# Patient Record
Sex: Female | Born: 1960 | Race: White | Hispanic: No | Smoking: Never smoker
Health system: Southern US, Community
[De-identification: ages and names within clinical notes are randomized; demographics above are authoritative.]

## PROBLEM LIST (undated history)

## (undated) DIAGNOSIS — J309 Allergic rhinitis, unspecified: Secondary | ICD-10-CM

## (undated) DIAGNOSIS — M810 Age-related osteoporosis without current pathological fracture: Secondary | ICD-10-CM

## (undated) DIAGNOSIS — E785 Hyperlipidemia, unspecified: Secondary | ICD-10-CM

## (undated) DIAGNOSIS — R42 Dizziness and giddiness: Secondary | ICD-10-CM

## (undated) DIAGNOSIS — F32A Depression, unspecified: Secondary | ICD-10-CM

## (undated) DIAGNOSIS — F419 Anxiety disorder, unspecified: Secondary | ICD-10-CM

## (undated) DIAGNOSIS — F329 Major depressive disorder, single episode, unspecified: Secondary | ICD-10-CM

## (undated) HISTORY — DX: Allergic rhinitis, unspecified: J30.9

## (undated) HISTORY — DX: Dizziness and giddiness: R42

## (undated) HISTORY — DX: Depression, unspecified: F32.A

## (undated) HISTORY — DX: Age-related osteoporosis without current pathological fracture: M81.0

## (undated) HISTORY — DX: Anxiety disorder, unspecified: F41.9

## (undated) HISTORY — PX: HERNIA REPAIR: SHX51

## (undated) HISTORY — DX: Major depressive disorder, single episode, unspecified: F32.9

## (undated) HISTORY — DX: Hyperlipidemia, unspecified: E78.5

---

## 1998-09-27 ENCOUNTER — Other Ambulatory Visit: Admission: RE | Admit: 1998-09-27 | Discharge: 1998-09-27 | Payer: Self-pay | Admitting: Obstetrics and Gynecology

## 1999-10-25 ENCOUNTER — Other Ambulatory Visit: Admission: RE | Admit: 1999-10-25 | Discharge: 1999-10-25 | Payer: Self-pay | Admitting: Obstetrics and Gynecology

## 2001-03-25 ENCOUNTER — Other Ambulatory Visit: Admission: RE | Admit: 2001-03-25 | Discharge: 2001-03-25 | Payer: Self-pay | Admitting: Obstetrics and Gynecology

## 2002-04-14 ENCOUNTER — Other Ambulatory Visit: Admission: RE | Admit: 2002-04-14 | Discharge: 2002-04-14 | Payer: Self-pay | Admitting: Obstetrics and Gynecology

## 2003-07-16 ENCOUNTER — Other Ambulatory Visit: Admission: RE | Admit: 2003-07-16 | Discharge: 2003-07-16 | Payer: Self-pay | Admitting: Obstetrics and Gynecology

## 2003-07-23 ENCOUNTER — Encounter: Admission: RE | Admit: 2003-07-23 | Discharge: 2003-07-23 | Payer: Self-pay | Admitting: Obstetrics and Gynecology

## 2004-08-01 ENCOUNTER — Encounter: Admission: RE | Admit: 2004-08-01 | Discharge: 2004-08-01 | Payer: Self-pay | Admitting: Obstetrics and Gynecology

## 2004-10-19 ENCOUNTER — Other Ambulatory Visit: Admission: RE | Admit: 2004-10-19 | Discharge: 2004-10-19 | Payer: Self-pay | Admitting: Obstetrics and Gynecology

## 2005-09-27 ENCOUNTER — Encounter: Admission: RE | Admit: 2005-09-27 | Discharge: 2005-09-27 | Payer: Self-pay | Admitting: Obstetrics and Gynecology

## 2005-10-12 ENCOUNTER — Encounter: Admission: RE | Admit: 2005-10-12 | Discharge: 2005-10-12 | Payer: Self-pay | Admitting: Obstetrics and Gynecology

## 2006-10-14 ENCOUNTER — Encounter: Admission: RE | Admit: 2006-10-14 | Discharge: 2006-10-14 | Payer: Self-pay | Admitting: Obstetrics and Gynecology

## 2007-10-22 ENCOUNTER — Encounter: Admission: RE | Admit: 2007-10-22 | Discharge: 2007-10-22 | Payer: Self-pay | Admitting: Obstetrics and Gynecology

## 2008-03-31 ENCOUNTER — Ambulatory Visit: Payer: Self-pay | Admitting: Family Medicine

## 2008-04-02 ENCOUNTER — Telehealth (INDEPENDENT_AMBULATORY_CARE_PROVIDER_SITE_OTHER): Payer: Self-pay | Admitting: *Deleted

## 2008-04-02 LAB — CONVERTED CEMR LAB
Albumin: 4.1 g/dL (ref 3.5–5.2)
Alkaline Phosphatase: 71 units/L (ref 39–117)
BUN: 11 mg/dL (ref 6–23)
Basophils Absolute: 0.1 10*3/uL (ref 0.0–0.1)
Bilirubin, Direct: 0.1 mg/dL (ref 0.0–0.3)
Cholesterol: 227 mg/dL (ref 0–200)
Eosinophils Absolute: 0.2 10*3/uL (ref 0.0–0.7)
GFR calc Af Amer: 98 mL/min
GFR calc non Af Amer: 81 mL/min
HCT: 39.6 % (ref 36.0–46.0)
HDL: 81.2 mg/dL (ref >39.0)
MCHC: 34.1 g/dL (ref 30.0–36.0)
MCV: 94.2 fL (ref 78.0–100.0)
Monocytes Absolute: 0.5 10*3/uL (ref 0.1–1.0)
Platelets: 228 10*3/uL (ref 150–400)
Potassium: 5.4 meq/L — ABNORMAL HIGH (ref 3.5–5.1)
RDW: 12.1 % (ref 11.5–14.6)
Sodium: 142 meq/L (ref 135–145)
Total Bilirubin: 0.8 mg/dL (ref 0.3–1.2)
Triglycerides: 71 mg/dL (ref 0–149)

## 2008-04-16 ENCOUNTER — Ambulatory Visit: Payer: Self-pay | Admitting: Family Medicine

## 2008-04-19 ENCOUNTER — Encounter (INDEPENDENT_AMBULATORY_CARE_PROVIDER_SITE_OTHER): Payer: Self-pay | Admitting: *Deleted

## 2008-04-19 LAB — CONVERTED CEMR LAB
Chloride: 105 meq/L (ref 96–112)
GFR calc Af Amer: 98 mL/min
Glucose, Bld: 82 mg/dL (ref 70–99)
Potassium: 3.9 meq/L (ref 3.5–5.1)
Sodium: 137 meq/L (ref 135–145)

## 2008-10-26 ENCOUNTER — Encounter: Admission: RE | Admit: 2008-10-26 | Discharge: 2008-10-26 | Payer: Self-pay | Admitting: Obstetrics and Gynecology

## 2009-03-28 ENCOUNTER — Telehealth (INDEPENDENT_AMBULATORY_CARE_PROVIDER_SITE_OTHER): Payer: Self-pay | Admitting: *Deleted

## 2009-04-04 ENCOUNTER — Ambulatory Visit: Payer: Self-pay | Admitting: Internal Medicine

## 2009-04-04 DIAGNOSIS — M545 Low back pain, unspecified: Secondary | ICD-10-CM | POA: Insufficient documentation

## 2009-04-04 DIAGNOSIS — M549 Dorsalgia, unspecified: Secondary | ICD-10-CM | POA: Insufficient documentation

## 2009-04-04 LAB — CONVERTED CEMR LAB
Bilirubin Urine: NEGATIVE
Ketones, urine, test strip: NEGATIVE
Urobilinogen, UA: 0.2
pH: 5

## 2009-08-22 ENCOUNTER — Encounter: Admission: RE | Admit: 2009-08-22 | Discharge: 2009-08-22 | Payer: Self-pay | Admitting: Obstetrics and Gynecology

## 2009-10-31 ENCOUNTER — Encounter: Admission: RE | Admit: 2009-10-31 | Discharge: 2009-10-31 | Payer: Self-pay | Admitting: Obstetrics and Gynecology

## 2010-05-12 ENCOUNTER — Ambulatory Visit: Payer: Self-pay | Admitting: Family Medicine

## 2010-05-12 DIAGNOSIS — F4323 Adjustment disorder with mixed anxiety and depressed mood: Secondary | ICD-10-CM | POA: Insufficient documentation

## 2010-06-29 NOTE — Assessment & Plan Note (Signed)
Summary: anxiety/cbs   Vital Signs:  Patient profile:   50 year old female Weight:      118 pounds BMI:     18.14 Pulse rate:   122 / minute BP sitting:   122 / 68  (left arm)  Vitals Entered By: Doristine Devoid CMA (May 12, 2010 10:18 AM) CC: anxiety   History of Present Illness: 50 yo woman here today for anxiety.  discovered husband is having affair.  saw therapist yesterday- Britta Mccreedy ferrin.  has 2 daughters in HS, ages 51 ([place]) 44 Alison Stalling).  trying to protect kids.  therapist recommended meds- zoloft and klonopin.  pt is very stressed- not eating, having difficulty sleeping.  woman that husband was having affair with was the woman from his 1st affair 12 yrs ago- pt now knows that the affair never stopped.  husband actually bought other woman a house.  pt feels her life was 'stolen' and she's been 'living a lie'.  determined to make it through the holidays for the sake of her kids.  wants husband to leave but doesn't think he will.  'everybody loves a story...a train wreck...and that's not going to be me this year'.  denies SI/HI.  hasn't told family or friends yet.  Current Medications (verified): 1)  None  Allergies (verified): 1)  ! Sulfa  Past History:  Past medical, surgical, family and social histories (including risk factors) reviewed, and no changes noted (except as noted below).  Past Medical History: Reviewed history from 03/31/2008 and no changes required. hx of basal cells- [facility] Dr Candice Camp- GYN  Past Surgical History: Reviewed history from 03/31/2008 and no changes required. Csection (95, 97) Hernia 1967 Skin surgery  Family History: Reviewed history from 03/31/2008 and no changes required. Great aunt- breast CA, HTN, DM  Social History: Reviewed history from 04/04/2009 and no changes required. 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  trying to be strong and keep up appearances through the holidays.  fears what this will do to her children.  agree w/ therapist recommendations of controller med and benzo as needed for anxiety.  discussed meds and possible side effects.  total time spent w/ pt, 37 minutes, >50% spent counseling.  will follow closely.  Complete Medication List: 1)  Sertraline Hcl 50 Mg Tabs (Sertraline hcl) .Marland Kitchen.. 1 by mouth nightly 2)  Clonazepam 0.5 Mg Tabs (Clonazepam) .... 1/2-1 tab by mouth three times a day as needed for anxiety  Patient Instructions: 1)  Follow up in 3-4 weeks to recheck your mood and medicines 2)  I'll ask about a therapist recommendation and call you 3)  Remember, it's ok to not be strong every now and then! 4)  Hang in there!!! Prescriptions: CLONAZEPAM 0.5 MG TABS (CLONAZEPAM) 1/2-1 tab by mouth three times a day as needed for anxiety  #60 x 1   Entered and Authorized by:   Neena Rhymes MD   Signed by:   Neena Rhymes MD on 05/12/2010   Method used:   Print then Give to Patient   RxID:   [id number] SERTRALINE HCL 50 MG TABS (SERTRALINE HCL) 1 by mouth nightly  #  30 x 3   Entered and Authorized by:   Neena Rhymes MD   Signed by:   Neena Rhymes MD on 05/12/2010   Method used:   Print then Give to Patient   RxID:   [id number]    Orders Added: 1)  Est. Patient Level IV [25956]

## 2010-07-14 ENCOUNTER — Other Ambulatory Visit: Payer: Self-pay | Admitting: Obstetrics and Gynecology

## 2010-07-14 DIAGNOSIS — Z1231 Encounter for screening mammogram for malignant neoplasm of breast: Secondary | ICD-10-CM

## 2010-09-20 ENCOUNTER — Telehealth: Payer: Self-pay | Admitting: *Deleted

## 2010-09-20 NOTE — Telephone Encounter (Signed)
The pt dropped off papers in regards to jury duty and would like letter explaining exemption of the jury duty. Pt was notified per Dr. Beverely Low that she cannot excuse the pt because she does not have a qualifying condition. Per Beverely Low, she was seen in Dec 2011 for anxiety and depressed mood, and this does not qualify the pt for exemption. Pt is not happy with this and requests a call from Dr. Beverely Low personally.

## 2010-09-22 NOTE — Telephone Encounter (Signed)
Pts mother Leeanne Rio,  is a patient of Dr.Paz's- she states she cannot serve in jury duty due to her mothers conditions.  Dr.Tabori wrote on her jury duty letter that she could not serve due to her mother not being able to be left alone for extended periods of times. They need a letter from the mothers doctor explaining this. Dr.Paz would you write a letter validating this for this pt?

## 2010-09-22 NOTE — Telephone Encounter (Signed)
Left message explaining that we will not be able to write her out of jury duty.  Pt to call if she has questions.

## 2010-09-25 ENCOUNTER — Encounter: Payer: Self-pay | Admitting: Internal Medicine

## 2010-09-25 NOTE — Telephone Encounter (Signed)
Letter done

## 2010-09-25 NOTE — Telephone Encounter (Signed)
Will place up front

## 2011-01-24 ENCOUNTER — Ambulatory Visit
Admission: RE | Admit: 2011-01-24 | Discharge: 2011-01-24 | Disposition: A | Discharge status: Home / Self Care | Payer: 59 | Source: Ambulatory Visit | Attending: Obstetrics and Gynecology | Admitting: Obstetrics and Gynecology

## 2011-01-24 DIAGNOSIS — Z1231 Encounter for screening mammogram for malignant neoplasm of breast: Secondary | ICD-10-CM

## 2011-03-06 ENCOUNTER — Ambulatory Visit (INDEPENDENT_AMBULATORY_CARE_PROVIDER_SITE_OTHER): Payer: 59 | Admitting: Psychology

## 2011-03-06 DIAGNOSIS — F4323 Adjustment disorder with mixed anxiety and depressed mood: Secondary | ICD-10-CM

## 2011-03-22 ENCOUNTER — Ambulatory Visit (INDEPENDENT_AMBULATORY_CARE_PROVIDER_SITE_OTHER): Payer: 59 | Admitting: Psychology

## 2011-03-22 DIAGNOSIS — F4323 Adjustment disorder with mixed anxiety and depressed mood: Secondary | ICD-10-CM

## 2011-03-29 ENCOUNTER — Ambulatory Visit: Payer: 59 | Admitting: Psychology

## 2011-04-05 ENCOUNTER — Ambulatory Visit (INDEPENDENT_AMBULATORY_CARE_PROVIDER_SITE_OTHER): Payer: 59 | Admitting: Psychology

## 2011-04-05 DIAGNOSIS — F4323 Adjustment disorder with mixed anxiety and depressed mood: Secondary | ICD-10-CM

## 2011-04-12 ENCOUNTER — Ambulatory Visit: Payer: 59 | Admitting: Psychology

## 2011-04-17 ENCOUNTER — Ambulatory Visit: Payer: 59 | Admitting: Psychology

## 2011-04-26 ENCOUNTER — Ambulatory Visit (INDEPENDENT_AMBULATORY_CARE_PROVIDER_SITE_OTHER): Payer: 59 | Admitting: Psychology

## 2011-04-26 DIAGNOSIS — F4323 Adjustment disorder with mixed anxiety and depressed mood: Secondary | ICD-10-CM

## 2011-05-03 ENCOUNTER — Ambulatory Visit: Payer: 59 | Admitting: Psychology

## 2011-05-08 ENCOUNTER — Ambulatory Visit (INDEPENDENT_AMBULATORY_CARE_PROVIDER_SITE_OTHER): Payer: 59 | Admitting: Psychology

## 2011-05-08 DIAGNOSIS — F4323 Adjustment disorder with mixed anxiety and depressed mood: Secondary | ICD-10-CM

## 2011-05-17 ENCOUNTER — Ambulatory Visit (INDEPENDENT_AMBULATORY_CARE_PROVIDER_SITE_OTHER): Payer: 59 | Admitting: Psychology

## 2011-05-17 DIAGNOSIS — F4323 Adjustment disorder with mixed anxiety and depressed mood: Secondary | ICD-10-CM

## 2011-05-24 ENCOUNTER — Ambulatory Visit: Payer: 59 | Admitting: Psychology

## 2011-06-21 ENCOUNTER — Ambulatory Visit: Payer: 59 | Admitting: Psychology

## 2011-06-28 ENCOUNTER — Ambulatory Visit (INDEPENDENT_AMBULATORY_CARE_PROVIDER_SITE_OTHER): Payer: 59 | Admitting: Psychology

## 2011-06-28 DIAGNOSIS — F4323 Adjustment disorder with mixed anxiety and depressed mood: Secondary | ICD-10-CM

## 2011-07-04 ENCOUNTER — Encounter: Payer: Self-pay | Admitting: Family Medicine

## 2011-07-04 ENCOUNTER — Ambulatory Visit (INDEPENDENT_AMBULATORY_CARE_PROVIDER_SITE_OTHER): Payer: 59 | Admitting: Family Medicine

## 2011-07-04 DIAGNOSIS — Z0000 Encounter for general adult medical examination without abnormal findings: Secondary | ICD-10-CM

## 2011-07-04 LAB — LIPID PANEL
Cholesterol: 264 mg/dL — ABNORMAL HIGH (ref 0–200)
Triglycerides: 79 mg/dL (ref 0.0–149.0)

## 2011-07-04 LAB — CBC WITH DIFFERENTIAL/PLATELET
Eosinophils Relative: 4.3 % (ref 0.0–5.0)
HCT: 38.8 % (ref 36.0–46.0)
Hemoglobin: 13.2 g/dL (ref 12.0–15.0)
Lymphs Abs: 1.6 10*3/uL (ref 0.7–4.0)
Monocytes Relative: 7.9 % (ref 3.0–12.0)
Neutro Abs: 3.9 10*3/uL (ref 1.4–7.7)
WBC: 6.3 10*3/uL (ref 4.5–10.5)

## 2011-07-04 LAB — BASIC METABOLIC PANEL
CO2: 27 mEq/L (ref 19–32)
Calcium: 9.6 mg/dL (ref 8.4–10.5)
Creatinine, Ser: 0.6 mg/dL (ref 0.4–1.2)
Glucose, Bld: 86 mg/dL (ref 70–99)

## 2011-07-04 LAB — HEPATIC FUNCTION PANEL
Albumin: 4.3 g/dL (ref 3.5–5.2)
Total Protein: 7.1 g/dL (ref 6.0–8.3)

## 2011-07-04 NOTE — Progress Notes (Signed)
  Subjective:    Patient ID: Alexandra Hopkins, female    DOB: 02-14-1961, 51 y.o.   MRN: [id number]  HPI CPE- UTD on pap (Dr Rana Snare), Mammo.  Due for colonoscopy- referred to [facility] by [facility].   Review of Systems Patient reports no vision/ hearing changes, adenopathy,fever, weight change,  persistant/recurrent hoarseness , swallowing issues, chest pain, palpitations, edema, persistant/recurrent cough, hemoptysis, dyspnea (rest/exertional/paroxysmal nocturnal), gastrointestinal bleeding (melena, rectal bleeding), abdominal pain, significant heartburn, bowel changes, GU symptoms (dysuria, hematuria, incontinence), Gyn symptoms (abnormal  bleeding, pain),  syncope, focal weakness, memory loss, numbness & tingling, skin/hair/nail changes, abnormal bruising or bleeding.     Objective:   Physical Exam General Appearance:    Alert, cooperative, no distress, appears stated age  Head:    Normocephalic, without obvious abnormality, atraumatic  Eyes:    PERRL, conjunctiva/corneas clear, EOM's intact, fundi    benign, both eyes  Ears:    Normal TM's and external ear canals, both ears  Nose:   Nares normal, septum midline, mucosa normal, no drainage    or sinus tenderness  Throat:   Lips, mucosa, and tongue normal; teeth and gums normal  Neck:   Supple, symmetrical, trachea midline, no adenopathy;    Thyroid: no enlargement/tenderness/nodules  Back:     Symmetric, no curvature, ROM normal, no CVA tenderness  Lungs:     Clear to auscultation bilaterally, respirations unlabored  Chest Wall:    No tenderness or deformity   Heart:    Regular rate and rhythm, S1 and S2 normal, no murmur, rub   or gallop  Breast Exam:    Deferred to GYN  Abdomen:     Soft, non-tender, bowel sounds active all four quadrants,    no masses, no organomegaly  Genitalia:    Deferred to GYN  Rectal:    Extremities:   Extremities normal, atraumatic, no cyanosis or edema  Pulses:   2+ and symmetric all extremities    Skin:   Skin color, texture, turgor normal, no rashes or lesions  Lymph nodes:   Cervical, supraclavicular, and axillary nodes normal  Neurologic:   CNII-XII intact, normal strength, sensation and reflexes    throughout          Assessment & Plan:

## 2011-07-04 NOTE — Patient Instructions (Signed)
Follow up in 1 year or as needed We'll notify you of your lab results Call with any questions or concerns Hang in there!!!  You can do this!!!

## 2011-07-05 ENCOUNTER — Ambulatory Visit: Payer: 59 | Admitting: Psychology

## 2011-07-05 LAB — VITAMIN D 25 HYDROXY (VIT D DEFICIENCY, FRACTURES): Vit D, 25-Hydroxy: 24 ng/mL — ABNORMAL LOW (ref 30–89)

## 2011-07-09 ENCOUNTER — Encounter: Payer: Self-pay | Admitting: *Deleted

## 2011-07-09 MED ORDER — ATORVASTATIN CALCIUM 10 MG PO TABS: 10.0000 mg | ORAL_TABLET | Freq: Every day | ORAL | Status: DC

## 2011-07-09 MED ORDER — ERGOCALCIFEROL 1.25 MG (50000 UT) PO CAPS: 50000.0000 [IU] | ORAL_CAPSULE | ORAL | Status: AC

## 2011-07-11 NOTE — Assessment & Plan Note (Signed)
Pt's PE WNL.  UTD on GYN.  Has colonoscopy upcoming.  Check labs.  Anticipatory guidance provided.

## 2011-07-12 ENCOUNTER — Ambulatory Visit (INDEPENDENT_AMBULATORY_CARE_PROVIDER_SITE_OTHER): Payer: 59 | Admitting: Psychology

## 2011-07-12 DIAGNOSIS — F4323 Adjustment disorder with mixed anxiety and depressed mood: Secondary | ICD-10-CM

## 2011-07-19 ENCOUNTER — Ambulatory Visit: Payer: 59 | Admitting: Psychology

## 2011-07-26 ENCOUNTER — Ambulatory Visit (INDEPENDENT_AMBULATORY_CARE_PROVIDER_SITE_OTHER): Payer: 59 | Admitting: Psychology

## 2011-07-26 ENCOUNTER — Encounter: Payer: Self-pay | Admitting: *Deleted

## 2011-07-26 DIAGNOSIS — F4323 Adjustment disorder with mixed anxiety and depressed mood: Secondary | ICD-10-CM

## 2011-08-02 ENCOUNTER — Ambulatory Visit: Payer: 59 | Admitting: Psychology

## 2011-08-09 ENCOUNTER — Ambulatory Visit: Payer: 59 | Admitting: Psychology

## 2011-09-06 ENCOUNTER — Ambulatory Visit: Payer: 59 | Admitting: Psychology

## 2011-09-20 ENCOUNTER — Ambulatory Visit: Payer: 59 | Admitting: Psychology

## 2011-10-04 ENCOUNTER — Ambulatory Visit: Payer: 59 | Admitting: Psychology

## 2011-10-15 ENCOUNTER — Ambulatory Visit (INDEPENDENT_AMBULATORY_CARE_PROVIDER_SITE_OTHER): Payer: 59 | Admitting: Family Medicine

## 2011-10-15 ENCOUNTER — Encounter: Payer: Self-pay | Admitting: Family Medicine

## 2011-10-15 VITALS — BP 108/72 | HR 60 | Temp 98.2°F | Ht 66.25 in | Wt 120.4 lb

## 2011-10-15 DIAGNOSIS — F4323 Adjustment disorder with mixed anxiety and depressed mood: Secondary | ICD-10-CM

## 2011-10-15 MED ORDER — VENLAFAXINE HCL ER 37.5 MG PO CP24: 37.5000 mg | ORAL_CAPSULE | Freq: Every day | ORAL | Status: DC

## 2011-10-15 NOTE — Progress Notes (Signed)
  Subjective:    Patient ID: Alexandra Hopkins, female    DOB: Sep 21, 1960, 51 y.o.   MRN: [id number]  HPI Depression/anxiety- sxs started 1 yr ago when pt uncovered husband's long-term affair.  Separation is ongoing w/ multiple attempts at reconciliation.  Has 2 teen daughters that she has been trying to 'be strong for'.  Is having 'more down days than good days'.  Kids want dad around but pt feels less anxious when he stays away.  Friend recommended Viibryd.   Review of Systems For ROS see HPI     Objective:   Physical Exam  Vitals reviewed. Constitutional: She appears well-developed and well-nourished.  Psychiatric:       Tearful, anxious          Assessment & Plan:

## 2011-10-15 NOTE — Assessment & Plan Note (Signed)
Deteriorated.  Start SNRI daily, refer for counseling.

## 2011-10-15 NOTE — Patient Instructions (Signed)
Follow up in 1 month to recheck meds Start the Effexor daily Call [phone] and ask to schedule an appt w/ [facility] Call with any questions or concerns Hang in there!!

## 2011-10-18 ENCOUNTER — Ambulatory Visit: Payer: 59 | Admitting: Psychology

## 2011-10-23 ENCOUNTER — Telehealth: Payer: Self-pay | Admitting: Family Medicine

## 2011-10-23 NOTE — Telephone Encounter (Signed)
I spoke with patient and informed her that she was not recently seen for sinus infection and would need an appointment for ABX. Patient agreed to see Dr.Paz tomorrow at 10:00 am (Patient's primary-Dr.Tabori did not have any openings)

## 2011-10-23 NOTE — Telephone Encounter (Signed)
Patient called and would like an antibiotic called in for a sinus infections. She stated she was just in here 5.20.13. - Was seen for depression/anxiety Can call patient at [phone]

## 2011-10-24 ENCOUNTER — Encounter: Payer: Self-pay | Admitting: Internal Medicine

## 2011-10-24 ENCOUNTER — Ambulatory Visit (INDEPENDENT_AMBULATORY_CARE_PROVIDER_SITE_OTHER): Payer: 59 | Admitting: Internal Medicine

## 2011-10-24 VITALS — BP 102/68 | HR 81 | Temp 97.7°F | Wt 119.0 lb

## 2011-10-24 DIAGNOSIS — J069 Acute upper respiratory infection, unspecified: Secondary | ICD-10-CM

## 2011-10-24 MED ORDER — AZELASTINE HCL 0.1 % NA SOLN: 2.0000 | Freq: Two times a day (BID) | NASAL | Status: DC

## 2011-10-24 MED ORDER — AMOXICILLIN 500 MG PO CAPS: 1000.0000 mg | ORAL_CAPSULE | Freq: Two times a day (BID) | ORAL | Status: AC

## 2011-10-24 NOTE — Patient Instructions (Signed)
Rest, fluids , tylenol Take Mucinex  twice a day as needed  For congestion use astelin nasal spray twice a day until you feel better Change Claritin-D to Claritin 10 mg one daily until you feel better Take the antibiotic as prescribed  (Amoxicillin) only if not improving in the next 3-4  days Call if no better in few days Call anytime if the symptoms are severe

## 2011-10-24 NOTE — Progress Notes (Signed)
  Subjective:    Patient ID: Alexandra Hopkins, female    DOB: 1960-12-27, 51 y.o.   MRN: [id number]  HPI Acute visit One-week history of sinus congestion, thick clear nasal discharge. Has been taking Claritin-D and NyQuil without much help.  Past medical history High cholesterol Menopause  Past surgical history C-section x2  Review of Systems No fever or chills No cough or chest congestion No postnasal dripping. Was recently seen with anxiety, still thinking about taking Effexor but is not sure. Currently not taking it    Objective:   Physical Exam General -- alert, well-developed, and well-nourished.   HEENT -- TMs normal, throat w/o redness, face symmetric and not tender to palpation, nose is slightly congested  Lungs -- normal respiratory effort, no intercostal retractions, no accessory muscle use, and normal breath sounds.   Heart-- normal rate, regular rhythm, no murmur, and no gallop.   Extremities-- no pretibial edema bilaterally Psych-- Cognition and judgment appear intact. Alert and cooperative with normal attention span and concentration.  not anxious appearing and not depressed appearing.       Assessment & Plan:   URI, see instructions

## 2012-02-14 ENCOUNTER — Other Ambulatory Visit: Payer: Self-pay | Admitting: Obstetrics and Gynecology

## 2012-02-14 DIAGNOSIS — Z1231 Encounter for screening mammogram for malignant neoplasm of breast: Secondary | ICD-10-CM

## 2012-03-07 ENCOUNTER — Ambulatory Visit: Payer: 59

## 2012-03-27 ENCOUNTER — Ambulatory Visit: Payer: 59

## 2012-04-21 ENCOUNTER — Ambulatory Visit
Admission: RE | Admit: 2012-04-21 | Discharge: 2012-04-21 | Disposition: A | Discharge status: Home / Self Care | Payer: 59 | Source: Ambulatory Visit | Attending: Obstetrics and Gynecology | Admitting: Obstetrics and Gynecology

## 2012-04-21 DIAGNOSIS — Z1231 Encounter for screening mammogram for malignant neoplasm of breast: Secondary | ICD-10-CM

## 2012-06-04 ENCOUNTER — Ambulatory Visit (INDEPENDENT_AMBULATORY_CARE_PROVIDER_SITE_OTHER): Payer: 59 | Admitting: Family Medicine

## 2012-06-04 ENCOUNTER — Encounter: Payer: Self-pay | Admitting: Family Medicine

## 2012-06-04 VITALS — BP 110/60 | HR 82 | Temp 97.7°F | Ht 66.25 in | Wt 117.4 lb

## 2012-06-04 DIAGNOSIS — J329 Chronic sinusitis, unspecified: Secondary | ICD-10-CM

## 2012-06-04 MED ORDER — AMOXICILLIN 875 MG PO TABS: 875.0000 mg | ORAL_TABLET | Freq: Two times a day (BID) | ORAL | Status: DC

## 2012-06-04 NOTE — Progress Notes (Signed)
  Subjective:    Patient ID: Alexandra Hopkins, female    DOB: 1960/10/12, 52 y.o.   MRN: [id number]  HPI ? Sinus infxn- sxs started 7-10 days ago, + nasal congestion, fatigue.  Recent air travel.  + sinus pain/pressure.  + PND at night, dry cough, bilateral ear pressure.  Hx of seasonal allergies and multiple sinus infections.  No fevers.  + sick contacts.   Review of Systems For ROS see HPI     Objective:   Physical Exam  Constitutional: She appears well-developed and well-nourished. No distress.  HENT:  Head: Normocephalic and atraumatic.  Right Ear: Tympanic membrane normal.  Left Ear: Tympanic membrane normal.  Nose: Mucosal edema and rhinorrhea present. Right sinus exhibits maxillary sinus tenderness and frontal sinus tenderness. Left sinus exhibits maxillary sinus tenderness and frontal sinus tenderness.  Mouth/Throat: Uvula is midline and mucous membranes are normal. Posterior oropharyngeal erythema present. No oropharyngeal exudate.  Eyes: Conjunctivae normal and EOM are normal. Pupils are equal, round, and reactive to light.  Neck: Normal range of motion. Neck supple.  Cardiovascular: Normal rate, regular rhythm and normal heart sounds.   Pulmonary/Chest: Effort normal and breath sounds normal. No respiratory distress. She has no wheezes.  Lymphadenopathy:    She has no cervical adenopathy.          Assessment & Plan:

## 2012-06-04 NOTE — Assessment & Plan Note (Signed)
New.  Start abx.  Reviewed supportive care and red flags that should prompt return.  Pt expressed understanding and is in agreement w/ plan.  

## 2012-06-04 NOTE — Patient Instructions (Addendum)
This is a sinus infection Start the Amox twice daily- take w/ food Mucinex to thin your congestion Drink plenty of fluids REST! Hang in there!!

## 2012-07-11 ENCOUNTER — Encounter: Payer: 59 | Admitting: Family Medicine

## 2012-07-18 ENCOUNTER — Telehealth: Payer: Self-pay | Admitting: *Deleted

## 2012-07-18 ENCOUNTER — Other Ambulatory Visit (INDEPENDENT_AMBULATORY_CARE_PROVIDER_SITE_OTHER): Payer: 59

## 2012-07-18 DIAGNOSIS — Z0000 Encounter for general adult medical examination without abnormal findings: Secondary | ICD-10-CM

## 2012-07-18 LAB — CBC WITH DIFFERENTIAL/PLATELET
Basophils Relative: 0.6 % (ref 0.0–3.0)
Eosinophils Absolute: 0.2 10*3/uL (ref 0.0–0.7)
Eosinophils Relative: 3.3 % (ref 0.0–5.0)
Lymphocytes Relative: 27.4 % (ref 12.0–46.0)
MCHC: 33.4 g/dL (ref 30.0–36.0)
Neutrophils Relative %: 61.4 % (ref 43.0–77.0)
RBC: 4.29 Mil/uL (ref 3.87–5.11)
WBC: 6.4 10*3/uL (ref 4.5–10.5)

## 2012-07-18 LAB — TESTOSTERONE: Testosterone: 21.96 ng/dL (ref 10.00–70.00)

## 2012-07-18 LAB — BASIC METABOLIC PANEL
BUN: 13 mg/dL (ref 6–23)
Chloride: 106 mEq/L (ref 96–112)
Potassium: 4 mEq/L (ref 3.5–5.1)

## 2012-07-18 LAB — HEPATIC FUNCTION PANEL
ALT: 16 U/L (ref 0–35)
AST: 20 U/L (ref 0–37)
Total Bilirubin: 0.7 mg/dL (ref 0.3–1.2)

## 2012-07-18 LAB — LDL CHOLESTEROL, DIRECT: Direct LDL: 147.9 mg/dL

## 2012-07-18 LAB — LIPID PANEL
HDL: 75.9 mg/dL (ref >39.00)
Total CHOL/HDL Ratio: 4
VLDL: 19.8 mg/dL (ref 0.0–40.0)

## 2012-07-18 NOTE — Telephone Encounter (Signed)
Pt states that she was a little concern because she did not received any paperwork from lab visit and just wanted to verify that the correct labs orders were drawn. Spoke with Pt and advise her of the office protocol for collecting labs, Pt ok info and states that she feels a lot better now that she knows her blood is not lost..

## 2012-07-18 NOTE — Telephone Encounter (Signed)
Patient presented to office, thought her CPE appt was today but it is not actually scheduled until next month. Patient requested to have labs drawn today.Marland Kitchenalso adamant on having testosterone levels drawn along with other labs. Dr. Beverely Low made aware.

## 2012-07-22 ENCOUNTER — Telehealth: Payer: Self-pay | Admitting: *Deleted

## 2012-07-22 MED ORDER — ATORVASTATIN CALCIUM 10 MG PO TABS: 10.0000 mg | ORAL_TABLET | Freq: Every day | ORAL | Status: DC

## 2012-07-22 NOTE — Telephone Encounter (Signed)
Message copied by Verdie Shire on Tue Jul 22, 2012  4:30 PM ------      Message from: Sheliah Hatch      Created: Sun Jul 20, 2012  5:28 PM       Pt's labs look good w/ exception of total cholesterol and LDL.  Needs to restart Lipitor 10mg  nightly. ------

## 2012-07-22 NOTE — Telephone Encounter (Signed)
Spoke with the pt and informed her of recent lab results and note.  Pt understood and agreed.  New rx sent to the pharmacy([facility]) by e-script.//AB/CMA

## 2012-07-24 ENCOUNTER — Encounter: Payer: Self-pay | Admitting: *Deleted

## 2012-07-28 ENCOUNTER — Telehealth: Payer: Self-pay | Admitting: *Deleted

## 2012-08-15 ENCOUNTER — Encounter: Payer: 59 | Admitting: Family Medicine

## 2012-08-22 ENCOUNTER — Encounter: Payer: 59 | Admitting: Family Medicine

## 2012-08-22 ENCOUNTER — Encounter: Payer: Self-pay | Admitting: Family Medicine

## 2012-08-22 ENCOUNTER — Ambulatory Visit (INDEPENDENT_AMBULATORY_CARE_PROVIDER_SITE_OTHER): Payer: 59 | Admitting: Family Medicine

## 2012-08-22 VITALS — BP 110/50 | HR 77 | Temp 98.1°F | Ht 66.5 in | Wt 117.4 lb

## 2012-08-22 DIAGNOSIS — Z23 Encounter for immunization: Secondary | ICD-10-CM

## 2012-08-22 DIAGNOSIS — Z0000 Encounter for general adult medical examination without abnormal findings: Secondary | ICD-10-CM

## 2012-08-22 NOTE — Patient Instructions (Addendum)
Follow up in 6 months to recheck cholesterol Continue the Lipitor daily Keep up the good work!  You look great! Happy Early [place] Ouch!!!

## 2012-08-22 NOTE — Progress Notes (Signed)
  Subjective:    Patient ID: Alexandra Hopkins, female    DOB: [birth year], 52 y.o.   MRN: [id number]  HPI CPE- UTD on pap, mammo, colonoscopy.  No concerns.   Review of Systems Patient reports no vision/ hearing changes, adenopathy,fever, weight change,  persistant/recurrent hoarseness , swallowing issues, chest pain, palpitations, edema, persistant/recurrent cough, hemoptysis, dyspnea (rest/exertional/paroxysmal nocturnal), gastrointestinal bleeding (melena, rectal bleeding), abdominal pain, significant heartburn, bowel changes, GU symptoms (dysuria, hematuria, incontinence), Gyn symptoms (abnormal  bleeding, pain),  syncope, focal weakness, memory loss, numbness & tingling, skin/hair/nail changes, abnormal bruising or bleeding, anxiety, or depression.     Objective:   Physical Exam General Appearance:    Alert, cooperative, no distress, appears stated age  Head:    Normocephalic, without obvious abnormality, atraumatic  Eyes:    PERRL, conjunctiva/corneas clear, EOM's intact, fundi    benign, both eyes  Ears:    Normal TM's and external ear canals, both ears  Nose:   Nares normal, septum midline, mucosa normal, no drainage    or sinus tenderness  Throat:   Lips, mucosa, and tongue normal; teeth and gums normal  Neck:   Supple, symmetrical, trachea midline, no adenopathy;    Thyroid: no enlargement/tenderness/nodules  Back:     Symmetric, no curvature, ROM normal, no CVA tenderness  Lungs:     Clear to auscultation bilaterally, respirations unlabored  Chest Wall:    No tenderness or deformity   Heart:    Regular rate and rhythm, S1 and S2 normal, no murmur, rub   or gallop  Breast Exam:    Deferred to GYN  Abdomen:     Soft, non-tender, bowel sounds active all four quadrants,    no masses, no organomegaly  Genitalia:    Deferred to GYN  Rectal:    Extremities:   Extremities normal, atraumatic, no cyanosis or edema  Pulses:   2+ and symmetric all extremities  Skin:   Skin color,  texture, turgor normal, no rashes or lesions  Lymph nodes:   Cervical, supraclavicular, and axillary nodes normal  Neurologic:   CNII-XII intact, normal strength, sensation and reflexes    throughout          Assessment & Plan:

## 2012-08-22 NOTE — Assessment & Plan Note (Signed)
Pt's PE WNL.  UTD on health maintenance.  Reviewed labs.  EKG done- see document for interpretation. Anticipatory guidance provided.

## 2012-09-18 ENCOUNTER — Telehealth: Payer: Self-pay | Admitting: Family Medicine

## 2012-09-18 NOTE — Telephone Encounter (Signed)
LM @ (1:56pm) asking the pt to RTC.//AB/CMA

## 2012-09-18 NOTE — Telephone Encounter (Signed)
Pt was seen 1 month ago- not last week.  Needs OV prior to abx.  Tomorrow is our best option and I can see her if she is unable to see Dr Drue Novel at [facility].

## 2012-09-18 NOTE — Telephone Encounter (Signed)
Pt called today and wanted an appointment with Dr Beverely Low for a sinus infection. I told pt that Dr Beverely Low did not have any available today nor any other provider. At that point pt started to get very aggravated and hostile as the conversation went on. Told pt that I could get her in tomorrow with dr Drue Novel at 9:00am. She did not like that time spot either and stated that she will just get there when ever she can. She also stated can Dr Beverely Low nurse call her back because she was just in here last week and maybe her nurse could write her a rx. I told pt that I would put a phone a note in for her to call her back. Pt then stated that she see why people go to an urgent care because it shouldn't take a rocket scientist to write a rx for a z-pack.

## 2012-09-18 NOTE — Telephone Encounter (Signed)
Unfortunately if pt is unable to make any available appts, the next best option is an UC

## 2012-09-18 NOTE — Telephone Encounter (Signed)
Spoke with the pt and she stated that she has a meeting on tomorrow and it is going be hard for her to come in here and wait to be seen.  Informed her of Dr. Rennis Golden recommendation below.  Informed her that Dr. Beverely Low could see her tomorrow afternoon if she is unable to see Dr. Drue Novel in the am, and she stated that her meeting is going to last all afternoon and she can't come then.  Pt said she will see if she can make the appt in the am with Dr. Drue Novel.  Pt kept talking about she has a meeting and it starts at 9:00am and she has all these peoples coming to the meeting, and she had to work around their schedules.  She asked if Dr. Beverely Low could see her today and I explained that Dr. Beverely Low has just gotten back from been out of the office, and her schedule is full.  Pt stated I know.  Pt asked me if I can see why people go to the Urgent Care.  Informed the pt again that she will need to be seen before we can give her a ATB.  Pt stated okay.//AB/CMA

## 2012-09-19 ENCOUNTER — Encounter: Payer: Self-pay | Admitting: Internal Medicine

## 2012-09-19 ENCOUNTER — Ambulatory Visit (INDEPENDENT_AMBULATORY_CARE_PROVIDER_SITE_OTHER): Payer: 59 | Admitting: Internal Medicine

## 2012-09-19 VITALS — BP 98/62 | HR 73 | Temp 97.9°F | Wt 117.0 lb

## 2012-09-19 DIAGNOSIS — J309 Allergic rhinitis, unspecified: Secondary | ICD-10-CM

## 2012-09-19 MED ORDER — ZOLPIDEM TARTRATE 10 MG PO TABS: 10.0000 mg | ORAL_TABLET | Freq: Every evening | ORAL | Status: DC | PRN

## 2012-09-19 MED ORDER — AMOXICILLIN 500 MG PO CAPS: 1000.0000 mg | ORAL_CAPSULE | Freq: Two times a day (BID) | ORAL | Status: AC

## 2012-09-19 MED ORDER — AZELASTINE HCL 0.1 % NA SOLN: 2.0000 | Freq: Two times a day (BID) | NASAL | Status: DC

## 2012-09-19 NOTE — Patient Instructions (Addendum)
If  cough, take Mucinex DM twice a day as needed  For congestion use astelin nasal spray twice a day until you feel better OTC claritin 10 mg 1 a day Call if no better in few days Call anytime if the symptoms are severe

## 2012-09-19 NOTE — Assessment & Plan Note (Addendum)
Symptoms consistent with allergic rhinitis but she is sure has a infection. Plan: Treat as allergies, see instructions. If not better will start amoxicillin.

## 2012-09-19 NOTE — Progress Notes (Signed)
  Subjective:    Patient ID: Alexandra Hopkins, female    DOB: 03/24/1961, 52 y.o.   MRN: [id number]  HPI Acute visit Symptoms started 2 weeks ago: Sore throat, head and ears feel "full". Has not taken any specific medication for this symptoms, denies cough. Also uses half Ambien sporadically, would like a prescription.  Past Medical History  Diagnosis Date  . Anxiety   . Hyperlipidemia   . Allergic rhinitis    Past Surgical History  Procedure Laterality Date  . Cesarean section      x 2    Review of Systems Denies fever chills, mild sneezing, nose itching. Denies eye symptoms. No nausea, vomiting, diarrhea.    Objective:   Physical Exam  General -- alert, well-developed, No apparent distress, vital signs are stable    HEENT -- TMs normal, throat w/o redness, face symmetric and not tender to palpation,Nose is slightly congested Lungs -- normal respiratory effort, no intercostal retractions, no accessory muscle use, and normal breath sounds.   Heart-- normal rate, regular rhythm, no murmur, and no gallop.   Extremities-- no pretibial edema bilaterally Psych-- Cognition and judgment appear intact. Alert and cooperative with normal attention span and concentration.  not anxious appearing and not depressed appearing.      Assessment & Plan:

## 2012-09-25 ENCOUNTER — Telehealth: Payer: Self-pay | Admitting: Family Medicine

## 2012-09-25 NOTE — Telephone Encounter (Signed)
Ok for Zpack 

## 2012-09-25 NOTE — Telephone Encounter (Signed)
Patient Information:  Caller Name: [name]  Phone: [phone]  Patient: Alexandra Hopkins  Gender: Female  DOB: [birth year]  Age: 51 Years  PCP: Sheliah Hatch  Pregnant: No  Office Follow Up:  Does the office need to follow up with this patient?: Yes  Instructions For The Office: See RN Notes  RN Note:  Patient is requesting prescription for Z Pack since Amoxicillin is not helping her symptoms.  Symptoms  Reason For Call & Symptoms: Reports was seen in office last week for sinus infection. Given Amoxicillin to take and symptoms are not improving. Still having nasal congestion, headache, etc.  Reviewed Health History In EMR: Yes  Reviewed Medications In EMR: Yes  Reviewed Allergies In EMR: Yes  Reviewed Surgeries / Procedures: Yes  Date of Onset of Symptoms: 09/16/2012  Treatments Tried: Amoxicillin twice a day.  Treatments Tried Worked: No OB / GYN:  LMP: Unknown  Guideline(s) Used:  Sinus Pain and Congestion  Disposition Per Guideline:   Go to Office Now  Reason For Disposition Reached:   Severe sinus pain  Advice Given:  N/A  Patient Refused Recommendation:  Patient Requests Prescription  Wants Z Pack called in.

## 2012-09-26 MED ORDER — AZITHROMYCIN 250 MG PO TABS: ORAL_TABLET | ORAL | Status: DC

## 2012-09-26 NOTE — Telephone Encounter (Signed)
Spoke with the pt and informed her that Dr. Tawni Carnes the Zpak, and new rx will be sent to her pharmacy.  Pt agreed.//AB/CMA

## 2013-02-17 ENCOUNTER — Encounter: Payer: Self-pay | Admitting: Lab

## 2013-02-18 ENCOUNTER — Ambulatory Visit: Payer: 59 | Admitting: Family Medicine

## 2013-02-18 DIAGNOSIS — Z0289 Encounter for other administrative examinations: Secondary | ICD-10-CM

## 2013-03-19 ENCOUNTER — Other Ambulatory Visit: Payer: Self-pay

## 2013-03-19 DIAGNOSIS — Z1231 Encounter for screening mammogram for malignant neoplasm of breast: Secondary | ICD-10-CM

## 2013-04-27 LAB — HM PAP SMEAR: HM Pap smear: NORMAL

## 2013-04-30 ENCOUNTER — Ambulatory Visit
Admission: RE | Admit: 2013-04-30 | Discharge: 2013-04-30 | Disposition: A | Discharge status: Home / Self Care | Payer: 59 | Source: Ambulatory Visit

## 2013-04-30 DIAGNOSIS — Z1231 Encounter for screening mammogram for malignant neoplasm of breast: Secondary | ICD-10-CM

## 2014-03-26 ENCOUNTER — Other Ambulatory Visit: Payer: Self-pay

## 2014-03-26 DIAGNOSIS — Z1231 Encounter for screening mammogram for malignant neoplasm of breast: Secondary | ICD-10-CM

## 2014-04-09 ENCOUNTER — Ambulatory Visit (INDEPENDENT_AMBULATORY_CARE_PROVIDER_SITE_OTHER): Payer: 59 | Admitting: Family Medicine

## 2014-04-09 ENCOUNTER — Encounter: Payer: Self-pay | Admitting: Family Medicine

## 2014-04-09 DIAGNOSIS — Z0000 Encounter for general adult medical examination without abnormal findings: Secondary | ICD-10-CM

## 2014-04-09 LAB — CBC WITH DIFFERENTIAL/PLATELET
BASOS ABS: 0 10*3/uL (ref 0.0–0.1)
BASOS PCT: 0 % (ref 0–1)
EOS ABS: 0.2 10*3/uL (ref 0.0–0.7)
Eosinophils Relative: 2 % (ref 0–5)
HCT: 40.6 % (ref 36.0–46.0)
HEMOGLOBIN: 13.7 g/dL (ref 12.0–15.0)
Lymphocytes Relative: 21 % (ref 12–46)
Lymphs Abs: 1.8 10*3/uL (ref 0.7–4.0)
MCH: 30.8 pg (ref 26.0–34.0)
MCHC: 33.7 g/dL (ref 30.0–36.0)
MCV: 91.2 fL (ref 78.0–100.0)
MONOS PCT: 12 % (ref 3–12)
Monocytes Absolute: 1 10*3/uL (ref 0.1–1.0)
NEUTROS ABS: 5.5 10*3/uL (ref 1.7–7.7)
NEUTROS PCT: 65 % (ref 43–77)
PLATELETS: 261 10*3/uL (ref 150–400)
RBC: 4.45 MIL/uL (ref 3.87–5.11)
RDW: 13.5 % (ref 11.5–15.5)
WBC: 8.4 10*3/uL (ref 4.0–10.5)

## 2014-04-09 LAB — TSH: TSH: 1.836 u[IU]/mL (ref 0.350–4.500)

## 2014-04-09 MED ORDER — ZOLPIDEM TARTRATE 10 MG PO TABS: 10.0000 mg | ORAL_TABLET | Freq: Every evening | ORAL | Status: DC | PRN

## 2014-04-09 NOTE — Patient Instructions (Signed)
Follow up in 1 year or as needed We'll notify you of your lab results and make any changes if needed Keep up the good work!  You look great!! Call with any questions or concerns Happy Holidays!!! 

## 2014-04-09 NOTE — Progress Notes (Signed)
   Subjective:    Patient ID: Alexandra Hopkins, female    DOB: 01/28/1961, 53 y.o.   MRN: [id number]  HPI CPE- UTD on colonoscopy, mammo, GYN.   Review of Systems Patient reports no vision/ hearing changes, adenopathy,fever, weight change,  persistant/recurrent hoarseness , swallowing issues, chest pain, palpitations, edema, persistant/recurrent cough, hemoptysis, dyspnea (rest/exertional/paroxysmal nocturnal), gastrointestinal bleeding (melena, rectal bleeding), abdominal pain, significant heartburn, bowel changes, GU symptoms (dysuria, hematuria, incontinence), Gyn symptoms (abnormal  bleeding, pain),  syncope, focal weakness, memory loss, numbness & tingling, skin/hair/nail changes, abnormal bruising or bleeding, anxiety, or depression.     Objective:   Physical Exam General Appearance:    Alert, cooperative, no distress, appears stated age  Head:    Normocephalic, without obvious abnormality, atraumatic  Eyes:    PERRL, conjunctiva/corneas clear, EOM's intact, fundi    benign, both eyes  Ears:    Normal TM's and external ear canals, both ears  Nose:   Nares normal, septum midline, mucosa normal, no drainage    or sinus tenderness  Throat:   Lips, mucosa, and tongue normal; teeth and gums normal  Neck:   Supple, symmetrical, trachea midline, no adenopathy;    Thyroid: no enlargement/tenderness/nodules  Back:     Symmetric, no curvature, ROM normal, no CVA tenderness  Lungs:     Clear to auscultation bilaterally, respirations unlabored  Chest Wall:    No tenderness or deformity   Heart:    Regular rate and rhythm, S1 and S2 normal, no murmur, rub   or gallop  Breast Exam:    Deferred to GYN  Abdomen:     Soft, non-tender, bowel sounds active all four quadrants,    no masses, no organomegaly  Genitalia:    Deferred to GYN  Rectal:    Extremities:   Extremities normal, atraumatic, no cyanosis or edema  Pulses:   2+ and symmetric all extremities  Skin:   Skin color, texture, turgor  normal, no rashes or lesions  Lymph nodes:   Cervical, supraclavicular, and axillary nodes normal  Neurologic:   CNII-XII intact, normal strength, sensation and reflexes    throughout          Assessment & Plan:

## 2014-04-09 NOTE — Progress Notes (Signed)
Pre visit review using our clinic review tool, if applicable. No additional management support is needed unless otherwise documented below in the visit note. 

## 2014-04-10 LAB — LIPID PANEL
CHOL/HDL RATIO: 3 ratio
CHOLESTEROL: 227 mg/dL — AB (ref 0–200)
HDL: 75 mg/dL (ref >39)
Triglycerides: 401 mg/dL — ABNORMAL HIGH (ref <150)

## 2014-04-10 LAB — BASIC METABOLIC PANEL
BUN: 12 mg/dL (ref 6–23)
CHLORIDE: 108 meq/L (ref 96–112)
CO2: 26 meq/L (ref 19–32)
Calcium: 9.9 mg/dL (ref 8.4–10.5)
Creat: 0.96 mg/dL (ref 0.50–1.10)
GLUCOSE: 93 mg/dL (ref 70–99)
POTASSIUM: 5.3 meq/L (ref 3.5–5.3)
SODIUM: 142 meq/L (ref 135–145)

## 2014-04-10 LAB — VITAMIN D 25 HYDROXY (VIT D DEFICIENCY, FRACTURES): VIT D 25 HYDROXY: 37 ng/mL (ref 30–89)

## 2014-04-10 LAB — HEPATIC FUNCTION PANEL
ALBUMIN: 4.4 g/dL (ref 3.5–5.2)
ALK PHOS: 96 U/L (ref 39–117)
ALT: 11 U/L (ref 0–35)
AST: 17 U/L (ref 0–37)
BILIRUBIN INDIRECT: 0.3 mg/dL (ref 0.2–1.2)
Bilirubin, Direct: 0.1 mg/dL (ref 0.0–0.3)
TOTAL PROTEIN: 6.9 g/dL (ref 6.0–8.3)
Total Bilirubin: 0.4 mg/dL (ref 0.2–1.2)

## 2014-04-11 NOTE — Assessment & Plan Note (Signed)
Pt's PE WNL.  UTD on GYN and colonoscopy.  Check labs.  Anticipatory guidance provided.  

## 2014-04-12 ENCOUNTER — Encounter: Payer: Self-pay | Admitting: General Practice

## 2014-05-03 ENCOUNTER — Ambulatory Visit: Payer: 59

## 2014-05-12 ENCOUNTER — Ambulatory Visit: Payer: 59

## 2014-05-20 ENCOUNTER — Ambulatory Visit: Payer: 59

## 2014-05-24 ENCOUNTER — Ambulatory Visit
Admission: RE | Admit: 2014-05-24 | Discharge: 2014-05-24 | Disposition: A | Discharge status: Home / Self Care | Payer: 59 | Source: Ambulatory Visit

## 2014-05-24 DIAGNOSIS — Z1231 Encounter for screening mammogram for malignant neoplasm of breast: Secondary | ICD-10-CM

## 2014-06-02 ENCOUNTER — Encounter: Payer: 59 | Admitting: Family Medicine

## 2015-04-07 ENCOUNTER — Encounter: Payer: Self-pay | Admitting: Medical

## 2015-04-07 ENCOUNTER — Ambulatory Visit (INDEPENDENT_AMBULATORY_CARE_PROVIDER_SITE_OTHER): Payer: BLUE CROSS/BLUE SHIELD | Admitting: Medical

## 2015-04-07 VITALS — BP 106/68 | HR 67 | Temp 97.7°F | Ht 66.0 in | Wt 125.4 lb

## 2015-04-07 DIAGNOSIS — J3489 Other specified disorders of nose and nasal sinuses: Secondary | ICD-10-CM | POA: Diagnosis not present

## 2015-04-07 DIAGNOSIS — R42 Dizziness and giddiness: Secondary | ICD-10-CM

## 2015-04-07 DIAGNOSIS — M542 Cervicalgia: Secondary | ICD-10-CM

## 2015-04-07 MED ORDER — MECLIZINE HCL 12.5 MG PO TABS: 12.5000 mg | ORAL_TABLET | Freq: Three times a day (TID) | ORAL | Status: DC | PRN

## 2015-04-07 MED ORDER — TIZANIDINE HCL 4 MG PO CAPS: ORAL_CAPSULE | ORAL | Status: DC

## 2015-04-07 NOTE — Progress Notes (Signed)
   Subjective:    Patient ID: Yehuda SavannahLisa S Colarusso, female    DOB: 11/24/1960, 54 y.o.   MRN: [id number]  HPI  Pt in states this Tuesday had acute onset of dizziness/vertigo with head movements. Describes that she can induce dizziness with particular head movements. Pt has some faint nasal congestion.  Pt states feels tired since Tuesday. Pt has some neck soreness. Pt has no fever or sweats. Yesterday had mild chill. But not now.  Pt not dizzy or having vertigo today but feels like can come back at any time. Some was transient yesterday.  Some tension and stress with water leak in house and potential cost.      Review of Systems  Constitutional: Positive for fatigue. Negative for fever, chills, diaphoresis and activity change.  Respiratory: Negative for cough, chest tightness and shortness of breath.   Cardiovascular: Negative for chest pain, palpitations and leg swelling.  Gastrointestinal: Negative for nausea, vomiting and abdominal pain.  Musculoskeletal: Negative for neck pain and neck stiffness.  Skin: Negative for rash.  Neurological: Positive for dizziness. Negative for seizures, weakness, numbness and headaches.  Hematological: Negative for adenopathy. Does not bruise/bleed easily.  Psychiatric/Behavioral: Negative for behavioral problems, confusion and agitation. The patient is not nervous/anxious.        Objective:   Physical Exam  General Mental Status- Alert. General Appearance- Not in acute distress.   Heent- negative on exam(but in bed when vertigo started she reported left sinus pressure)  Skin General: Color- Normal Color. Moisture- Normal Moisture.  Neck Carotid Arteries- Normal color. Moisture- Normal Moisture. No carotid bruits. No JVD. Mild trapezius tenderness  Chest and Lung Exam Auscultation: Breath Sounds:-Normal. CTA.  Cardiovascular Auscultation:Rythm- Regular. Murmurs & Other Heart Sounds:Auscultation of the heart reveals- No  Murmurs.  Abdomen Inspection:-Inspeection Normal. Palpation/Percussion:Note:No mass. Palpation and Percussion of the abdomen reveal- Non Tender, Non Distended + BS, no rebound or guarding.    Neurologic Cranial Nerve exam:- CN III-XII intact(No nystagmus), symmetric smile. Drift Test:- No drift. Romberg Exam:- Negative.  Heal to Toe Gait exam:-Normal. Finger to Nose:- Normal/Intact Strength:- 5/5 equal and symmetric strength both upper and lower extremities.      Assessment & Plan:  You do describe some symptoms of vertigo and have negative neurologic exam. I will rx meclizine and you can use if symptoms return and are constant.  If vertigo reoccurring  by Monday may consider PT.  You have mild neck soreness with good rom. If you get stiff neck with head then ED evaluation since evaluation of spinal fluid and Ct of head may be necessary under that scenario.   Some stress recently with neck tightness. Will give low dose zanaflex to use next 3 nights.  For recent brief sinus pressure could use nasocort which you have at home.  Folllow up in 7 days or as needed.  Did advise pt if her fatigue persists then would get cbc and cmp.

## 2015-04-07 NOTE — Patient Instructions (Addendum)
You do describe some symptoms of vertigo and have negative neurologic exam. I will rx meclizine and you can use if symptoms return and are constant.  If vertigo reoccurring  by Monday may consider PT.  You have mild neck soreness with good rom. If you get stiff neck with head then ED evaluation since evaluation of spinal fluid and Ct of head may be necessary under that scenario.   Some stress recently with neck tightness. Will give low dose zanaflex to use next 3 nights.  For recent brief sinus pressure could use nasocort which you have at home.  Folllow up in 7 days or as needed.

## 2015-04-07 NOTE — Progress Notes (Signed)
Pre visit review using our clinic review tool, if applicable. No additional management support is needed unless otherwise documented below in the visit note. 

## 2015-04-11 ENCOUNTER — Telehealth: Payer: Self-pay | Admitting: Family Medicine

## 2015-04-11 ENCOUNTER — Encounter: Payer: Self-pay | Admitting: Behavioral Health

## 2015-04-11 ENCOUNTER — Telehealth: Payer: Self-pay | Admitting: Behavioral Health

## 2015-04-11 NOTE — Telephone Encounter (Signed)
Pre-Visit Call completed with patient and chart updated.   Pre-Visit Info documented in Specialty Comments under [employer].    

## 2015-04-11 NOTE — Telephone Encounter (Signed)
Caller name: Self  Can be reached:[phone]   Reason for call: Patient returning call in ref to CPE with Tabori tomorrow

## 2015-04-11 NOTE — Telephone Encounter (Signed)
Unable to reach patient at time of Pre-Visit Call.  Left message for patient to return call when available.    

## 2015-04-11 NOTE — Addendum Note (Signed)
Addended by: Harold BarbanBYRD, RONECIA E on: 04/11/2015 05:15 PM   Modules accepted: Medications

## 2015-04-12 ENCOUNTER — Ambulatory Visit (INDEPENDENT_AMBULATORY_CARE_PROVIDER_SITE_OTHER): Payer: BLUE CROSS/BLUE SHIELD | Admitting: Family Medicine

## 2015-04-12 ENCOUNTER — Encounter: Payer: Self-pay | Admitting: Family Medicine

## 2015-04-12 VITALS — BP 110/70 | HR 69 | Temp 97.9°F | Resp 16 | Ht 66.0 in | Wt 124.4 lb

## 2015-04-12 DIAGNOSIS — Z0000 Encounter for general adult medical examination without abnormal findings: Secondary | ICD-10-CM

## 2015-04-12 DIAGNOSIS — J0100 Acute maxillary sinusitis, unspecified: Secondary | ICD-10-CM | POA: Diagnosis not present

## 2015-04-12 LAB — VITAMIN D 25 HYDROXY (VIT D DEFICIENCY, FRACTURES): VITD: 26 ng/mL — AB (ref 30.00–100.00)

## 2015-04-12 LAB — BASIC METABOLIC PANEL
BUN: 12 mg/dL (ref 6–23)
CALCIUM: 9.9 mg/dL (ref 8.4–10.5)
CHLORIDE: 107 meq/L (ref 96–112)
CO2: 27 meq/L (ref 19–32)
CREATININE: 0.7 mg/dL (ref 0.40–1.20)
GFR: 92.13 mL/min (ref >60.00)
GLUCOSE: 96 mg/dL (ref 70–99)
Potassium: 4.3 mEq/L (ref 3.5–5.1)
Sodium: 140 mEq/L (ref 135–145)

## 2015-04-12 LAB — LIPID PANEL
Cholesterol: 238 mg/dL — ABNORMAL HIGH (ref 0–200)
HDL: 76.6 mg/dL (ref >39.00)
LDL Cholesterol: 147 mg/dL — ABNORMAL HIGH (ref 0–99)
NONHDL: 161.67
TRIGLYCERIDES: 73 mg/dL (ref 0.0–149.0)
Total CHOL/HDL Ratio: 3
VLDL: 14.6 mg/dL (ref 0.0–40.0)

## 2015-04-12 LAB — CBC WITH DIFFERENTIAL/PLATELET
BASOS ABS: 0 10*3/uL (ref 0.0–0.1)
BASOS PCT: 0.5 % (ref 0.0–3.0)
EOS ABS: 0.1 10*3/uL (ref 0.0–0.7)
Eosinophils Relative: 2 % (ref 0.0–5.0)
HCT: 39.7 % (ref 36.0–46.0)
HEMOGLOBIN: 13.3 g/dL (ref 12.0–15.0)
LYMPHS PCT: 25.3 % (ref 12.0–46.0)
Lymphs Abs: 1.5 10*3/uL (ref 0.7–4.0)
MCHC: 33.5 g/dL (ref 30.0–36.0)
MCV: 90.5 fl (ref 78.0–100.0)
MONO ABS: 0.4 10*3/uL (ref 0.1–1.0)
Monocytes Relative: 6 % (ref 3.0–12.0)
Neutro Abs: 3.9 10*3/uL (ref 1.4–7.7)
Neutrophils Relative %: 66.2 % (ref 43.0–77.0)
PLATELETS: 265 10*3/uL (ref 150.0–400.0)
RBC: 4.39 Mil/uL (ref 3.87–5.11)
RDW: 13.5 % (ref 11.5–15.5)
WBC: 5.9 10*3/uL (ref 4.0–10.5)

## 2015-04-12 LAB — TSH: TSH: 2.61 u[IU]/mL (ref 0.35–4.50)

## 2015-04-12 LAB — HEPATIC FUNCTION PANEL
ALT: 12 U/L (ref 0–35)
AST: 17 U/L (ref 0–37)
Albumin: 4.2 g/dL (ref 3.5–5.2)
Alkaline Phosphatase: 91 U/L (ref 39–117)
BILIRUBIN TOTAL: 0.4 mg/dL (ref 0.2–1.2)
Bilirubin, Direct: 0.1 mg/dL (ref 0.0–0.3)
TOTAL PROTEIN: 6.8 g/dL (ref 6.0–8.3)

## 2015-04-12 MED ORDER — AMOXICILLIN 875 MG PO TABS: 875.0000 mg | ORAL_TABLET | Freq: Two times a day (BID) | ORAL | Status: DC

## 2015-04-12 NOTE — Assessment & Plan Note (Signed)
Pt's PE WNL w/ exception of sinus infxn.  UTD on GYN, colonoscopy, mammo.  Check labs.  Anticipatory guidance provided.

## 2015-04-12 NOTE — Assessment & Plan Note (Signed)
Pt's sxs and PE consistent w/ infxn.  This is likely contributing to pt's vertigo.  Start Amoxicillin as directed.  Meclizine prn.  Reviewed supportive care and red flags that should prompt return.  Pt expressed understanding and is in agreement w/ plan.

## 2015-04-12 NOTE — Patient Instructions (Signed)
Follow up in 1 year or as needed We'll notify you of your lab results and make any changes if needed Start the Amoxicillin twice daily- take w/ food- for the sinus infection Use the Meclizine as needed for the vertigo- particularly before bed Drink plenty of fluids Keep up the good work on healthy diet and regular exercise- you look great! Call with any questions or concerns If you want to join [place] at the new [place], any scheduled appointments will automatically transfer and we will see you at [address] [place], [place], [place] [id number] (OPENING 05/31/15) Happy Holidays!!!

## 2015-04-12 NOTE — Progress Notes (Signed)
   Subjective:    Patient ID: Alexandra Hopkins, female    DOB: 10/24/1960, 54 y.o.   MRN: [id number]  HPI CPE- UTD on GYN [facility]), mammo (due next month), colonoscopy (due 2023).  Vertigo- 1 week woke up while lying on L side w/ room spinning.  sxs resolved when she would lie on R side.  Was seen here on 11/10 and prescribed muscle relaxer and meclizine.  Pt felt worse on muscle relaxer over the weekend.  + lethargy.  + HA, sinus pressure.  No fevers.  No visual changes.   Review of Systems Patient reports no vision/ hearing changes, adenopathy,fever, weight change,  persistant/recurrent hoarseness , swallowing issues, chest pain, palpitations, edema, persistant/recurrent cough, hemoptysis, dyspnea (rest/exertional/paroxysmal nocturnal), gastrointestinal bleeding (melena, rectal bleeding), abdominal pain, significant heartburn, bowel changes, GU symptoms (dysuria, hematuria, incontinence), Gyn symptoms (abnormal  bleeding, pain),  syncope, focal weakness, memory loss, numbness & tingling, skin/hair/nail changes, abnormal bruising or bleeding, anxiety, or depression.     Objective:   Physical Exam  Constitutional: She is oriented to person, place, and time. She appears well-developed and well-nourished. No distress.  HENT:  Head: Normocephalic and atraumatic.  Right Ear: Tympanic membrane normal.  Left Ear: Tympanic membrane normal.  Nose: Mucosal edema and rhinorrhea present. Right sinus exhibits maxillary sinus tenderness and frontal sinus tenderness. Left sinus exhibits maxillary sinus tenderness and frontal sinus tenderness.  Mouth/Throat: Uvula is midline and mucous membranes are normal. Posterior oropharyngeal erythema present. No oropharyngeal exudate.  Eyes: Conjunctivae and EOM are normal. Pupils are equal, round, and reactive to light.  Neck: Normal range of motion. Neck supple.  Cardiovascular: Normal rate, regular rhythm and normal heart sounds.   Pulmonary/Chest: Effort normal and  breath sounds normal. No respiratory distress. She has no wheezes.  Abdominal: Soft. Bowel sounds are normal. She exhibits no distension. There is no tenderness. There is no rebound and no guarding.  Genitourinary:  Deferred to GYN  Musculoskeletal: She exhibits no edema.  Lymphadenopathy:    She has no cervical adenopathy.  Neurological: She is alert and oriented to person, place, and time. She has normal reflexes. No cranial nerve deficit. Coordination normal.  Skin: Skin is warm and dry. No rash noted. No erythema.  Psychiatric: She has a normal mood and affect. Her behavior is normal. Thought content normal.  Vitals reviewed.         Assessment & Plan:

## 2015-04-12 NOTE — Progress Notes (Signed)
Pre visit review using our clinic review tool, if applicable. No additional management support is needed unless otherwise documented below in the visit note. 

## 2015-04-14 ENCOUNTER — Encounter: Payer: Self-pay | Admitting: General Practice

## 2015-04-14 MED ORDER — VITAMIN D (ERGOCALCIFEROL) 1.25 MG (50000 UNIT) PO CAPS
50000.0000 [IU] | ORAL_CAPSULE | ORAL | Status: DC
Start: 2015-04-14 — End: 2016-04-25

## 2015-04-26 ENCOUNTER — Other Ambulatory Visit: Payer: Self-pay

## 2015-04-26 DIAGNOSIS — Z1231 Encounter for screening mammogram for malignant neoplasm of breast: Secondary | ICD-10-CM

## 2015-05-04 ENCOUNTER — Other Ambulatory Visit: Payer: Self-pay | Admitting: Medical

## 2015-06-07 ENCOUNTER — Ambulatory Visit: Payer: BLUE CROSS/BLUE SHIELD

## 2015-06-24 ENCOUNTER — Ambulatory Visit: Payer: BLUE CROSS/BLUE SHIELD

## 2015-07-07 ENCOUNTER — Ambulatory Visit: Payer: BLUE CROSS/BLUE SHIELD

## 2015-07-11 ENCOUNTER — Ambulatory Visit
Admission: RE | Admit: 2015-07-11 | Discharge: 2015-07-11 | Disposition: A | Discharge status: Home / Self Care | Payer: BLUE CROSS/BLUE SHIELD | Source: Ambulatory Visit

## 2015-07-11 DIAGNOSIS — Z1231 Encounter for screening mammogram for malignant neoplasm of breast: Secondary | ICD-10-CM

## 2015-07-19 ENCOUNTER — Other Ambulatory Visit: Payer: Self-pay | Admitting: Family Medicine

## 2015-07-19 MED ORDER — OSELTAMIVIR PHOSPHATE 75 MG PO CAPS: 75.0000 mg | ORAL_CAPSULE | Freq: Every day | ORAL | Status: DC

## 2015-11-18 ENCOUNTER — Ambulatory Visit (INDEPENDENT_AMBULATORY_CARE_PROVIDER_SITE_OTHER): Payer: BLUE CROSS/BLUE SHIELD | Admitting: Podiatry

## 2015-11-18 ENCOUNTER — Encounter: Payer: Self-pay | Admitting: Podiatry

## 2015-11-18 VITALS — BP 90/56 | HR 80 | Resp 16

## 2015-11-18 DIAGNOSIS — B351 Tinea unguium: Secondary | ICD-10-CM

## 2015-11-18 DIAGNOSIS — L603 Nail dystrophy: Secondary | ICD-10-CM

## 2015-11-18 MED ORDER — TERBINAFINE HCL 250 MG PO TABS: ORAL_TABLET | ORAL | Status: DC

## 2015-11-18 NOTE — Progress Notes (Signed)
   Subjective:    Patient ID: Alexandra Hopkins, female    DOB: 02/27/1961, 55 y.o.   MRN: [id number]  HPI Chief Complaint  Patient presents with  . Nail Problem    1st toenail right - discolored nail, her 120lb dog stepped on it and damaged the nail 4-5 months ago, no pain and no treatment      Review of Systems  All other systems reviewed and are negative.      Objective:   Physical Exam        Assessment & Plan:

## 2015-11-20 NOTE — Progress Notes (Signed)
Subjective:     Patient ID: Alexandra SavannahLisa S Hopkins, female   DOB: 10/23/1960, 55 y.o.   MRN: [id number]  HPI patient's found to have discoloration of the right hallux nail after was traumatized about 6 months ago by the dog. It has turned yellow and is disconcerting to the patient   Review of Systems  All other systems reviewed and are negative.      Objective:   Physical Exam  Constitutional: She is oriented to person, place, and time.  Cardiovascular: Intact distal pulses.   Musculoskeletal: Normal range of motion.  Neurological: She is oriented to person, place, and time.  Skin: Skin is warm.  Nursing note and vitals reviewed.  neurovascular status intact muscle strength adequate with patient found to have distal yellow discoloration of the right hallux nail lateral side and quite a bit on the medial side localized with some ridging of the nailbed. 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 At this point I recommended 3 laser applications along with pulse antifungal therapy and probable topical. Gave instructions on it and she will start laser in the next several weeks and prescription for pulse Lamisil was given today

## 2015-12-02 ENCOUNTER — Other Ambulatory Visit (INDEPENDENT_AMBULATORY_CARE_PROVIDER_SITE_OTHER): Payer: BLUE CROSS/BLUE SHIELD

## 2015-12-02 DIAGNOSIS — B351 Tinea unguium: Secondary | ICD-10-CM

## 2016-01-12 ENCOUNTER — Telehealth: Payer: Self-pay | Admitting: Podiatry

## 2016-01-12 NOTE — Telephone Encounter (Signed)
lvm for pt to call to reschedule her laser treatment appt

## 2016-01-13 ENCOUNTER — Other Ambulatory Visit: Payer: BLUE CROSS/BLUE SHIELD

## 2016-01-24 LAB — HM PAP SMEAR

## 2016-01-27 ENCOUNTER — Ambulatory Visit: Payer: BLUE CROSS/BLUE SHIELD

## 2016-01-27 DIAGNOSIS — B351 Tinea unguium: Secondary | ICD-10-CM

## 2016-01-27 DIAGNOSIS — L603 Nail dystrophy: Secondary | ICD-10-CM

## 2016-01-27 NOTE — Progress Notes (Signed)
Pt presents with mycotic infection of nail Rt hallux 80% cleared  All other systems are negative  Laser therapy administered to affected nails and tolerated well. All safety precautions were in place, re-appoint in 6 weeks for 3rd and final laser treatment

## 2016-03-12 ENCOUNTER — Other Ambulatory Visit: Payer: BLUE CROSS/BLUE SHIELD

## 2016-04-25 ENCOUNTER — Encounter: Payer: Self-pay | Admitting: General Practice

## 2016-04-25 ENCOUNTER — Ambulatory Visit (INDEPENDENT_AMBULATORY_CARE_PROVIDER_SITE_OTHER): Payer: BLUE CROSS/BLUE SHIELD | Admitting: Family Medicine

## 2016-04-25 ENCOUNTER — Encounter: Payer: Self-pay | Admitting: Family Medicine

## 2016-04-25 VITALS — BP 110/60 | HR 79 | Temp 98.1°F | Resp 17 | Ht 66.0 in | Wt 125.2 lb

## 2016-04-25 DIAGNOSIS — F4323 Adjustment disorder with mixed anxiety and depressed mood: Secondary | ICD-10-CM

## 2016-04-25 DIAGNOSIS — Z0000 Encounter for general adult medical examination without abnormal findings: Secondary | ICD-10-CM

## 2016-04-25 LAB — LIPID PANEL
CHOL/HDL RATIO: 3
CHOLESTEROL: 242 mg/dL — AB (ref 0–200)
HDL: 74.7 mg/dL (ref >39.00)
LDL CALC: 154 mg/dL — AB (ref 0–99)
NonHDL: 167.7
Triglycerides: 71 mg/dL (ref 0.0–149.0)
VLDL: 14.2 mg/dL (ref 0.0–40.0)

## 2016-04-25 LAB — BASIC METABOLIC PANEL
BUN: 15 mg/dL (ref 6–23)
CALCIUM: 9.8 mg/dL (ref 8.4–10.5)
CO2: 29 mEq/L (ref 19–32)
Chloride: 106 mEq/L (ref 96–112)
Creatinine, Ser: 0.77 mg/dL (ref 0.40–1.20)
GFR: 82.23 mL/min (ref >60.00)
GLUCOSE: 85 mg/dL (ref 70–99)
Potassium: 4.5 mEq/L (ref 3.5–5.1)
SODIUM: 140 meq/L (ref 135–145)

## 2016-04-25 LAB — CBC WITH DIFFERENTIAL/PLATELET
BASOS ABS: 0 10*3/uL (ref 0.0–0.1)
Basophils Relative: 0.6 % (ref 0.0–3.0)
EOS PCT: 2.4 % (ref 0.0–5.0)
Eosinophils Absolute: 0.2 10*3/uL (ref 0.0–0.7)
HEMATOCRIT: 39.2 % (ref 36.0–46.0)
Hemoglobin: 13.1 g/dL (ref 12.0–15.0)
LYMPHS PCT: 24.8 % (ref 12.0–46.0)
Lymphs Abs: 1.7 10*3/uL (ref 0.7–4.0)
MCHC: 33.3 g/dL (ref 30.0–36.0)
MCV: 91.2 fl (ref 78.0–100.0)
MONOS PCT: 6.8 % (ref 3.0–12.0)
Monocytes Absolute: 0.5 10*3/uL (ref 0.1–1.0)
NEUTROS ABS: 4.4 10*3/uL (ref 1.4–7.7)
Neutrophils Relative %: 65.4 % (ref 43.0–77.0)
Platelets: 256 10*3/uL (ref 150.0–400.0)
RBC: 4.3 Mil/uL (ref 3.87–5.11)
RDW: 13.2 % (ref 11.5–15.5)
WBC: 6.8 10*3/uL (ref 4.0–10.5)

## 2016-04-25 LAB — HEPATIC FUNCTION PANEL
ALBUMIN: 4.2 g/dL (ref 3.5–5.2)
ALT: 10 U/L (ref 0–35)
AST: 14 U/L (ref 0–37)
Alkaline Phosphatase: 85 U/L (ref 39–117)
BILIRUBIN DIRECT: 0.1 mg/dL (ref 0.0–0.3)
TOTAL PROTEIN: 6.5 g/dL (ref 6.0–8.3)
Total Bilirubin: 0.5 mg/dL (ref 0.2–1.2)

## 2016-04-25 LAB — VITAMIN D 25 HYDROXY (VIT D DEFICIENCY, FRACTURES): VITD: 21.84 ng/mL — ABNORMAL LOW (ref 30.00–100.00)

## 2016-04-25 LAB — TSH: TSH: 1.93 u[IU]/mL (ref 0.35–4.50)

## 2016-04-25 MED ORDER — CITALOPRAM HYDROBROMIDE 20 MG PO TABS: 20.0000 mg | ORAL_TABLET | Freq: Every day | ORAL | 3 refills | Status: DC

## 2016-04-25 NOTE — Patient Instructions (Signed)
Follow up in 4-6 weeks to recheck anxiety We'll notify you of your lab results and make any changes if needed Continue to work on healthy diet and regular exercise- you look great! Start the Celexa once daily for the anxiety Call with any questions or concerns Happy Holidays!!!  Hang in there!!!

## 2016-04-25 NOTE — Assessment & Plan Note (Signed)
Pt's PE WNL.  UTD on GYN.  UTD on colonoscopy, Tdap.  Check labs.  Anticipatory guidance provided.

## 2016-04-25 NOTE — Progress Notes (Signed)
Pre visit review using our clinic review tool, if applicable. No additional management support is needed unless otherwise documented below in the visit note. 

## 2016-04-25 NOTE — Progress Notes (Signed)
   Subjective:    Patient ID: Alexandra Hopkins, female    DOB: 10/31/1960, 55 y.o.   MRN: [id number]  HPI CPE- UTD on pap, mammo (Dr Seymour BarsLaVoie), colonoscopy, Tdap.  Declines flu shot   Review of Systems Patient reports no vision/ hearing changes, adenopathy,fever, weight change,  persistant/recurrent hoarseness , swallowing issues, chest pain, palpitations, edema, persistant/recurrent cough, hemoptysis, dyspnea (rest/exertional/paroxysmal nocturnal), gastrointestinal bleeding (melena, rectal bleeding), abdominal pain, significant heartburn, bowel changes, GU symptoms (dysuria, hematuria, incontinence), Gyn symptoms (abnormal  bleeding, pain),  syncope, focal weakness, memory loss, numbness & tingling, skin/hair/nail changes, abnormal bruising or bleeding.   + anxiety/depression- mom just recently passed away, pt is having high anxiety w/ holidays approaching.  Difficulty w/ sleeping.    Objective:   Physical Exam General Appearance:    Alert, cooperative, no distress, appears stated age  Head:    Normocephalic, without obvious abnormality, atraumatic  Eyes:    PERRL, conjunctiva/corneas clear, EOM's intact, fundi    benign, both eyes  Ears:    Normal TM's and external ear canals, both ears  Nose:   Nares normal, septum midline, mucosa normal, no drainage    or sinus tenderness  Throat:   Lips, mucosa, and tongue normal; teeth and gums normal  Neck:   Supple, symmetrical, trachea midline, no adenopathy;    Thyroid: no enlargement/tenderness/nodules  Back:     Symmetric, no curvature, ROM normal, no CVA tenderness  Lungs:     Clear to auscultation bilaterally, respirations unlabored  Chest Wall:    No tenderness or deformity   Heart:    Regular rate and rhythm, S1 and S2 normal, no murmur, rub   or gallop  Breast Exam:    Deferred to GYN  Abdomen:     Soft, non-tender, bowel sounds active all four quadrants,    no masses, no organomegaly  Genitalia:    Deferred to GYN  Rectal:      Extremities:   Extremities normal, atraumatic, no cyanosis or edema  Pulses:   2+ and symmetric all extremities  Skin:   Skin color, texture, turgor normal, no rashes or lesions  Lymph nodes:   Cervical, supraclavicular, and axillary nodes normal  Neurologic:   CNII-XII intact, normal strength, sensation and reflexes    throughout          Assessment & Plan:

## 2016-04-25 NOTE — Assessment & Plan Note (Signed)
Recurrent.  Pt's sxs returned w/ the recent loss of her mother.  Start Celexa once daily.  Will monitor for improvement.

## 2016-04-26 ENCOUNTER — Other Ambulatory Visit: Payer: Self-pay | Admitting: General Practice

## 2016-04-26 MED ORDER — VITAMIN D (ERGOCALCIFEROL) 1.25 MG (50000 UNIT) PO CAPS: 50000.0000 [IU] | ORAL_CAPSULE | ORAL | 0 refills | Status: DC

## 2016-06-19 ENCOUNTER — Other Ambulatory Visit: Payer: Self-pay | Admitting: Obstetrics & Gynecology

## 2016-06-19 DIAGNOSIS — Z1231 Encounter for screening mammogram for malignant neoplasm of breast: Secondary | ICD-10-CM

## 2016-07-23 ENCOUNTER — Ambulatory Visit: Payer: BLUE CROSS/BLUE SHIELD

## 2016-08-08 ENCOUNTER — Ambulatory Visit
Admission: RE | Admit: 2016-08-08 | Discharge: 2016-08-08 | Disposition: A | Discharge status: Home / Self Care | Payer: BLUE CROSS/BLUE SHIELD | Source: Ambulatory Visit | Attending: Obstetrics & Gynecology | Admitting: Obstetrics & Gynecology

## 2016-08-08 DIAGNOSIS — Z1231 Encounter for screening mammogram for malignant neoplasm of breast: Secondary | ICD-10-CM

## 2017-01-25 ENCOUNTER — Ambulatory Visit (INDEPENDENT_AMBULATORY_CARE_PROVIDER_SITE_OTHER): Payer: BLUE CROSS/BLUE SHIELD | Admitting: Obstetrics & Gynecology

## 2017-01-25 ENCOUNTER — Encounter: Payer: Self-pay | Admitting: Obstetrics & Gynecology

## 2017-01-25 VITALS — BP 122/78 | Ht 66.25 in | Wt 128.0 lb

## 2017-01-25 DIAGNOSIS — F329 Major depressive disorder, single episode, unspecified: Secondary | ICD-10-CM

## 2017-01-25 DIAGNOSIS — Z01419 Encounter for gynecological examination (general) (routine) without abnormal findings: Secondary | ICD-10-CM | POA: Diagnosis not present

## 2017-01-25 DIAGNOSIS — Z780 Asymptomatic menopausal state: Secondary | ICD-10-CM

## 2017-01-25 LAB — CBC
HEMATOCRIT: 41.1 % (ref 35.0–45.0)
Hemoglobin: 13.5 g/dL (ref 11.7–15.5)
MCH: 30 pg (ref 27.0–33.0)
MCHC: 32.8 g/dL (ref 32.0–36.0)
MCV: 91.3 fL (ref 80.0–100.0)
MPV: 9 fL (ref 7.5–12.5)
Platelets: 237 10*3/uL (ref 140–400)
RBC: 4.5 MIL/uL (ref 3.80–5.10)
RDW: 13.7 % (ref 11.0–15.0)
WBC: 5.8 10*3/uL (ref 3.8–10.8)

## 2017-01-25 NOTE — Progress Notes (Signed)
Alexandra Hopkins [birth year] [id number]   History:    56 y.o. G2P2 Divorced.  Daughters off to [employer].  RP:  Established patient presenting for annual gyn exam   HPI:  Has been through a lot in past few years.  Divorce (husband had a double life), mother died (very close to mother), daughters out of home to [place].  Currently feels very tired and depressed.  No suicidal ideation.  Unexplained 20 Lbs weight gain.  BMI 20.5.  Breasts wnl.  BMs/Mictions wnl.  Past medical history,surgical history, family history and social history were all reviewed and documented in the EPIC chart.  Gynecologic History No LMP recorded. Patient is postmenopausal. Contraception: abstinence Last Pap: 2017. Results were: Normal per patient, will obtain med records Last mammogram: 2018. Results were: normal [facility] 2013, appointment 02/2017 No recent Bone Density  Obstetric History OB History  Gravida Para Term Preterm AB Living  '2 2       2  '$ SAB TAB Ectopic Multiple Live Births               # Outcome Date GA Lbr Len/2nd Weight Sex Delivery Anes PTL Lv  2 Para           1 Para                ROS: A ROS was performed and pertinent positives and negatives are included in the history.  GENERAL: No fevers or chills. HEENT: No change in vision, no earache, sore throat or sinus congestion. NECK: No pain or stiffness. CARDIOVASCULAR: No chest pain or pressure. No palpitations. PULMONARY: No shortness of breath, cough or wheeze. GASTROINTESTINAL: No abdominal pain, nausea, vomiting or diarrhea, melena or bright red blood per rectum. GENITOURINARY: No urinary frequency, urgency, hesitancy or dysuria. MUSCULOSKELETAL: No joint or muscle pain, no back pain, no recent trauma. DERMATOLOGIC: No rash, no itching, no lesions. ENDOCRINE: No polyuria, polydipsia, no heat or cold intolerance. No recent change in weight. HEMATOLOGICAL: No anemia or easy bruising or bleeding. NEUROLOGIC: No headache, seizures, numbness,  tingling or weakness. PSYCHIATRIC: No depression, no loss of interest in normal activity or change in sleep pattern.     Exam:   BP 122/78   Ht 5' 6.25" (1.683 m)   Wt 128 lb (58.1 kg)   BMI 20.50 kg/m   Body mass index is 20.5 kg/m.  General appearance : Well developed well nourished female. No acute distress HEENT: Eyes: no retinal hemorrhage or exudates,  Neck supple, trachea midline, no carotid bruits, no thyroidmegaly Lungs: Clear to auscultation, no rhonchi or wheezes, or rib retractions  Heart: Regular rate and rhythm, no murmurs or gallops Breast:Examined in sitting and supine position were symmetrical in appearance, no palpable masses or tenderness,  no skin retraction, no nipple inversion, no nipple discharge, no skin discoloration, no axillary or supraclavicular lymphadenopathy Abdomen: no palpable masses or tenderness, no rebound or guarding Extremities: no edema or skin discoloration or tenderness  Pelvic: Vulva normal  Bartholin, Urethra, Skene Glands: Within normal limits             Vagina: No gross lesions or discharge  Cervix: No gross lesions or discharge.  Pap reflex done.  Uterus  AV, normal size, shape and consistency, non-tender and mobile  Adnexa  Without masses or tenderness  Anus and perineum  normal    Assessment/Plan:  56 y.o. female for annual exam   1. Encounter for routine gynecological examination with Papanicolaou smear of cervix  Normal gyn exam.  Pap reflex done.  Breasts wnl.  Screening mammo every year.  Will do fasting labs. - CBC - Comp Met (CMET) - Lipid panel - TSH - Vitamin D 1,25 dihydroxy - Pap IG w/ reflex to HPV when ASC-U  2. Menopause present No HRT.  No PMB.  No vasomotor Sxs, abstinent.  Declines HRT.  Vit D supplements/Ca++ in nutrition.  Weight bearing physical activity.  Overdue for Bone density, schedule today. - DG Bone Density; Future  3. 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  Will  increase physical activity.  Decision to start on AntiDepressant with Fluoxetine 20 mg PO qd.  Usage/risks/benefits reviewed.  Recommended to start Psychotherapy as well.  R/O Thyroid dysfunction.  3 month f/u with me as needed. - TSH  Counseling on above issues >50% x 10 minutes. Princess Bruins MD, 11:20 AM 01/25/2017

## 2017-01-26 DIAGNOSIS — M810 Age-related osteoporosis without current pathological fracture: Secondary | ICD-10-CM

## 2017-01-26 HISTORY — DX: Age-related osteoporosis without current pathological fracture: M81.0

## 2017-01-26 LAB — COMPREHENSIVE METABOLIC PANEL
ALBUMIN: 4.5 g/dL (ref 3.6–5.1)
ALK PHOS: 103 U/L (ref 33–130)
ALT: 14 U/L (ref 6–29)
AST: 19 U/L (ref 10–35)
BUN: 13 mg/dL (ref 7–25)
CALCIUM: 9.8 mg/dL (ref 8.6–10.4)
CO2: 18 mmol/L — ABNORMAL LOW (ref 20–32)
Chloride: 107 mmol/L (ref 98–110)
Creat: 0.82 mg/dL (ref 0.50–1.05)
Glucose, Bld: 100 mg/dL — ABNORMAL HIGH (ref 65–99)
POTASSIUM: 4.5 mmol/L (ref 3.5–5.3)
Sodium: 142 mmol/L (ref 135–146)
TOTAL PROTEIN: 6.9 g/dL (ref 6.1–8.1)
Total Bilirubin: 0.5 mg/dL (ref 0.2–1.2)

## 2017-01-26 LAB — LIPID PANEL
CHOLESTEROL: 272 mg/dL — AB (ref <200)
HDL: 93 mg/dL (ref >50)
LDL Cholesterol: 161 mg/dL — ABNORMAL HIGH (ref <100)
TRIGLYCERIDES: 88 mg/dL (ref <150)
Total CHOL/HDL Ratio: 2.9 Ratio (ref <5.0)
VLDL: 18 mg/dL (ref <30)

## 2017-01-26 LAB — TSH: TSH: 2.55 mIU/L

## 2017-01-28 LAB — VITAMIN D 1,25 DIHYDROXY
VITAMIN D2 1, 25 (OH): 9 pg/mL
Vitamin D 1, 25 (OH)2 Total: 52 pg/mL (ref 18–72)
Vitamin D3 1, 25 (OH)2: 43 pg/mL

## 2017-01-28 MED ORDER — FLUOXETINE HCL 20 MG PO CAPS: 20.0000 mg | ORAL_CAPSULE | Freq: Every day | ORAL | 5 refills | Status: DC

## 2017-01-28 NOTE — Patient Instructions (Signed)
1. Encounter for routine gynecological examination with Papanicolaou smear of cervix Normal gyn exam.  Pap reflex done.  Breasts wnl.  Screening mammo every year.  Will do fasting labs. - CBC - Comp Met (CMET) - Lipid panel - TSH - Vitamin D 1,25 dihydroxy - Pap IG w/ reflex to HPV when ASC-U  2. Menopause present No HRT.  No PMB.  No vasomotor Sxs, abstinent.  Declines HRT.  Vit D supplements/Ca++ in nutrition.  Weight bearing physical activity.  Overdue for Bone density, schedule today. - DG Bone Density; Future  3. 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  Will increase physical activity.  Decision to start on AntiDepressant with Fluoxetine 20 mg PO qd.  Usage/risks/benefits reviewed.  Recommended to start Psychotherapy as well.  R/O Thyroid dysfunction. - TSH  Alexandra Hopkins, it was a pleasure seeing you today!  I will inform you of your results as soon as available.   Health Maintenance for Postmenopausal Women Menopause is a normal process in which your reproductive ability comes to an end. This process happens gradually over a span of months to years, usually between the ages of 70 and 49. Menopause is complete when you have missed 12 consecutive menstrual periods. It is important to talk with your health care provider about some of the most common conditions that affect postmenopausal women, such as heart disease, cancer, and bone loss (osteoporosis). Adopting a healthy lifestyle and getting preventive care can help to promote your health and wellness. Those actions can also lower your chances of developing some of these common conditions. What should I know about menopause? During menopause, you may experience a number of symptoms, such as:  Moderate-to-severe hot flashes.  Night sweats.  Decrease in sex drive.  Mood swings.  Headaches.  Tiredness.  Irritability.  Memory problems.  Insomnia.  Choosing to treat or not to treat menopausal  changes is an individual decision that you make with your health care provider. What should I know about hormone replacement therapy and supplements? Hormone therapy products are effective for treating symptoms that are associated with menopause, such as hot flashes and night sweats. Hormone replacement carries certain risks, especially as you become older. If you are thinking about using estrogen or estrogen with progestin treatments, discuss the benefits and risks with your health care provider. What should I know about heart disease and stroke? Heart disease, heart attack, and stroke become more likely as you age. This may be due, in part, to the hormonal changes that your body experiences during menopause. These can affect how your body processes dietary fats, triglycerides, and cholesterol. Heart attack and stroke are both medical emergencies. There are many things that you can do to help prevent heart disease and stroke:  Have your blood pressure checked at least every 1-2 years. High blood pressure causes heart disease and increases the risk of stroke.  If you are 64-52 years old, ask your health care provider if you should take aspirin to prevent a heart attack or a stroke.  Do not use any tobacco products, including cigarettes, chewing tobacco, or electronic cigarettes. If you need help quitting, ask your health care provider.  It is important to eat a healthy diet and maintain a healthy weight. ? Be sure to include plenty of vegetables, fruits, low-fat dairy products, and lean protein. ? Avoid eating foods that are high in solid fats, added sugars, or salt (sodium).  Get regular exercise. This is one of the most important things that  you can do for your health. ? Try to exercise for at least 150 minutes each week. The type of exercise that you do should increase your heart rate and make you sweat. This is known as moderate-intensity exercise. ? Try to do strengthening exercises at least  twice each week. Do these in addition to the moderate-intensity exercise.  Know your numbers.Ask your health care provider to check your cholesterol and your blood glucose. Continue to have your blood tested as directed by your health care provider.  What should I know about cancer screening? There are several types of cancer. Take the following steps to reduce your risk and to catch any cancer development as early as possible. Breast Cancer  Practice breast self-awareness. ? This means understanding how your breasts normally appear and feel. ? It also means doing regular breast self-exams. Let your health care provider know about any changes, no matter how small.  If you are 36 or older, have a clinician do a breast exam (clinical breast exam or CBE) every year. Depending on your age, family history, and medical history, it may be recommended that you also have a yearly breast X-ray (mammogram).  If you have a family history of breast cancer, talk with your health care provider about genetic screening.  If you are at high risk for breast cancer, talk with your health care provider about having an MRI and a mammogram every year.  Breast cancer (BRCA) gene test is recommended for women who have family members with BRCA-related cancers. Results of the assessment will determine the need for genetic counseling and BRCA1 and for BRCA2 testing. BRCA-related cancers include these types: ? Breast. This occurs in males or females. ? Ovarian. ? Tubal. This may also be called fallopian tube cancer. ? Cancer of the abdominal or pelvic lining (peritoneal cancer). ? Prostate. ? Pancreatic.  Cervical, Uterine, and Ovarian Cancer Your health care provider may recommend that you be screened regularly for cancer of the pelvic organs. These include your ovaries, uterus, and vagina. This screening involves a pelvic exam, which includes checking for microscopic changes to the surface of your cervix (Pap  test).  For women ages 21-65, health care providers may recommend a pelvic exam and a Pap test every three years. For women ages 36-65, they may recommend the Pap test and pelvic exam, combined with testing for human papilloma virus (HPV), every five years. Some types of HPV increase your risk of cervical cancer. Testing for HPV may also be done on women of any age who have unclear Pap test results.  Other health care providers may not recommend any screening for nonpregnant women who are considered low risk for pelvic cancer and have no symptoms. Ask your health care provider if a screening pelvic exam is right for you.  If you have had past treatment for cervical cancer or a condition that could lead to cancer, you need Pap tests and screening for cancer for at least 20 years after your treatment. If Pap tests have been discontinued for you, your risk factors (such as having a new sexual partner) need to be reassessed to determine if you should start having screenings again. Some women have medical problems that increase the chance of getting cervical cancer. In these cases, your health care provider may recommend that you have screening and Pap tests more often.  If you have a family history of uterine cancer or ovarian cancer, talk with your health care provider about genetic screening.  If  you have vaginal bleeding after reaching menopause, tell your health care provider.  There are currently no reliable tests available to screen for ovarian cancer.  Lung Cancer Lung cancer screening is recommended for adults 72-36 years old who are at high risk for lung cancer because of a history of smoking. A yearly low-dose CT scan of the lungs is recommended if you:  Currently smoke.  Have a history of at least 30 pack-years of smoking and you currently smoke or have quit within the past 15 years. A pack-year is smoking an average of one pack of cigarettes per day for one year.  Yearly screening  should:  Continue until it has been 15 years since you quit.  Stop if you develop a health problem that would prevent you from having lung cancer treatment.  Colorectal Cancer  This type of cancer can be detected and can often be prevented.  Routine colorectal cancer screening usually begins at age 52 and continues through age 78.  If you have risk factors for colon cancer, your health care provider may recommend that you be screened at an earlier age.  If you have a family history of colorectal cancer, talk with your health care provider about genetic screening.  Your health care provider may also recommend using home test kits to check for hidden blood in your stool.  A small camera at the end of a tube can be used to examine your colon directly (sigmoidoscopy or colonoscopy). This is done to check for the earliest forms of colorectal cancer.  Direct examination of the colon should be repeated every 5-10 years until age 43. However, if early forms of precancerous polyps or small growths are found or if you have a family history or genetic risk for colorectal cancer, you may need to be screened more often.  Skin Cancer  Check your skin from head to toe regularly.  Monitor any moles. Be sure to tell your health care provider: ? About any new moles or changes in moles, especially if there is a change in a mole's shape or color. ? If you have a mole that is larger than the size of a pencil eraser.  If any of your family members has a history of skin cancer, especially at a young age, talk with your health care provider about genetic screening.  Always use sunscreen. Apply sunscreen liberally and repeatedly throughout the day.  Whenever you are outside, protect yourself by wearing long sleeves, pants, a wide-brimmed hat, and sunglasses.  What should I know about osteoporosis? Osteoporosis is a condition in which bone destruction happens more quickly than new bone creation. After  menopause, you may be at an increased risk for osteoporosis. To help prevent osteoporosis or the bone fractures that can happen because of osteoporosis, the following is recommended:  If you are 31-32 years old, get at least 1,000 mg of calcium and at least 600 mg of vitamin D per day.  If you are older than age 51 but younger than age 87, get at least 1,200 mg of calcium and at least 600 mg of vitamin D per day.  If you are older than age 68, get at least 1,200 mg of calcium and at least 800 mg of vitamin D per day.  Smoking and excessive alcohol intake increase the risk of osteoporosis. Eat foods that are rich in calcium and vitamin D, and do weight-bearing exercises several times each week as directed by your health care provider. What should I  know about how menopause affects my mental health? Depression may occur at any age, but it is more common as you become older. Common symptoms of depression include:  Low or sad mood.  Changes in sleep patterns.  Changes in appetite or eating patterns.  Feeling an overall lack of motivation or enjoyment of activities that you previously enjoyed.  Frequent crying spells.  Talk with your health care provider if you think that you are experiencing depression. What should I know about immunizations? It is important that you get and maintain your immunizations. These include:  Tetanus, diphtheria, and pertussis (Tdap) booster vaccine.  Influenza every year before the flu season begins.  Pneumonia vaccine.  Shingles vaccine.  Your health care provider may also recommend other immunizations. This information is not intended to replace advice given to you by your health care provider. Make sure you discuss any questions you have with your health care provider. Document Released: 07/06/2005 Document Revised: 12/02/2015 Document Reviewed: 02/15/2015 Elsevier Interactive Patient Education  2018 [employer].

## 2017-01-29 ENCOUNTER — Telehealth: Payer: Self-pay | Admitting: *Deleted

## 2017-01-29 NOTE — Telephone Encounter (Signed)
I called pt and gave her the number to [facility] [phone] to call and schedule as they prefer to speak with patient directly. Pt will call to schedule.

## 2017-01-29 NOTE — Telephone Encounter (Signed)
-----   Message from Genia DelMarie-Lyne Lavoie, MD sent at 01/25/2017 12:57 PM EDT ----- Regarding: Refer to Psychotherapist Situational Depression.  No suicidal ideation.  Would like a woman.  Please give suggestions.

## 2017-01-30 LAB — PAP IG W/ RFLX HPV ASCU

## 2017-02-13 ENCOUNTER — Other Ambulatory Visit: Payer: Self-pay | Admitting: Gynecology

## 2017-02-13 DIAGNOSIS — Z780 Asymptomatic menopausal state: Secondary | ICD-10-CM

## 2017-02-18 ENCOUNTER — Ambulatory Visit (INDEPENDENT_AMBULATORY_CARE_PROVIDER_SITE_OTHER): Payer: BLUE CROSS/BLUE SHIELD

## 2017-02-18 ENCOUNTER — Other Ambulatory Visit: Payer: Self-pay | Admitting: Gynecology

## 2017-02-18 DIAGNOSIS — M810 Age-related osteoporosis without current pathological fracture: Secondary | ICD-10-CM | POA: Diagnosis not present

## 2017-02-18 DIAGNOSIS — Z13820 Encounter for screening for osteoporosis: Secondary | ICD-10-CM

## 2017-02-18 DIAGNOSIS — Z780 Asymptomatic menopausal state: Secondary | ICD-10-CM

## 2017-02-19 ENCOUNTER — Encounter: Payer: Self-pay | Admitting: Gynecology

## 2017-02-19 ENCOUNTER — Telehealth: Payer: Self-pay | Admitting: Gynecology

## 2017-02-19 NOTE — Telephone Encounter (Signed)
Tell patient her bone density shows osteoporosis. Recommend office visit with Dr Seymour Bars to discuss evaluation and treatment options.

## 2017-02-20 ENCOUNTER — Encounter: Payer: Self-pay | Admitting: *Deleted

## 2017-02-20 NOTE — Telephone Encounter (Signed)
Sent mychart message

## 2017-02-21 NOTE — Telephone Encounter (Signed)
Patient read my chart message

## 2017-06-28 ENCOUNTER — Other Ambulatory Visit: Payer: Self-pay | Admitting: Obstetrics & Gynecology

## 2017-06-28 DIAGNOSIS — Z1231 Encounter for screening mammogram for malignant neoplasm of breast: Secondary | ICD-10-CM

## 2017-08-13 ENCOUNTER — Ambulatory Visit
Admission: RE | Admit: 2017-08-13 | Discharge: 2017-08-13 | Disposition: A | Discharge status: Home / Self Care | Payer: BLUE CROSS/BLUE SHIELD | Source: Ambulatory Visit | Attending: Obstetrics & Gynecology | Admitting: Obstetrics & Gynecology

## 2017-08-13 ENCOUNTER — Ambulatory Visit: Payer: BLUE CROSS/BLUE SHIELD

## 2017-08-13 DIAGNOSIS — Z1231 Encounter for screening mammogram for malignant neoplasm of breast: Secondary | ICD-10-CM

## 2017-08-20 ENCOUNTER — Encounter: Payer: Self-pay | Admitting: Family Medicine

## 2017-08-21 LAB — HM COLONOSCOPY

## 2017-08-30 ENCOUNTER — Encounter: Payer: Self-pay | Admitting: General Practice

## 2018-02-04 ENCOUNTER — Encounter: Payer: BLUE CROSS/BLUE SHIELD | Admitting: Obstetrics & Gynecology

## 2018-02-18 ENCOUNTER — Encounter: Payer: BLUE CROSS/BLUE SHIELD | Admitting: Obstetrics & Gynecology

## 2018-02-28 ENCOUNTER — Encounter: Payer: Self-pay | Admitting: Family Medicine

## 2018-03-24 ENCOUNTER — Telehealth: Payer: Self-pay | Admitting: Family Medicine

## 2018-03-24 ENCOUNTER — Ambulatory Visit: Payer: BLUE CROSS/BLUE SHIELD | Admitting: Family Medicine

## 2018-03-24 NOTE — Telephone Encounter (Signed)
Yes that is fine

## 2018-03-24 NOTE — Telephone Encounter (Signed)
Please advise if new patient appointment can be approved to reschedule due to circumstance.   Copied from CRM [id number]. Topic: Quick Communication - Appointment Cancellation >> Mar 24, 2018  8:41 AM Alexandra Hopkins wrote: Patient called to cancel appointment scheduled for 03/24/18. Patient's father has dementia and she had to go out of town unexpectedly.  Patient did not rescheduled their appointment. Patient stated she will reschedule the appt when she gets back into town.

## 2018-04-01 ENCOUNTER — Encounter: Payer: Self-pay | Admitting: Obstetrics & Gynecology

## 2018-04-01 ENCOUNTER — Ambulatory Visit (INDEPENDENT_AMBULATORY_CARE_PROVIDER_SITE_OTHER): Payer: BLUE CROSS/BLUE SHIELD | Admitting: Obstetrics & Gynecology

## 2018-04-01 VITALS — BP 128/86 | Ht 66.25 in | Wt 134.0 lb

## 2018-04-01 DIAGNOSIS — Z01419 Encounter for gynecological examination (general) (routine) without abnormal findings: Secondary | ICD-10-CM

## 2018-04-01 DIAGNOSIS — Z780 Asymptomatic menopausal state: Secondary | ICD-10-CM | POA: Diagnosis not present

## 2018-04-01 DIAGNOSIS — M810 Age-related osteoporosis without current pathological fracture: Secondary | ICD-10-CM | POA: Diagnosis not present

## 2018-04-01 DIAGNOSIS — Z1151 Encounter for screening for human papillomavirus (HPV): Secondary | ICD-10-CM | POA: Diagnosis not present

## 2018-04-01 MED ORDER — ALENDRONATE SODIUM 70 MG PO TABS: 70.0000 mg | ORAL_TABLET | ORAL | 4 refills | Status: AC

## 2018-04-01 NOTE — Progress Notes (Signed)
Alexandra Hopkins 25-Jul-1960 [id number]   History:    56 y.o. G2P2L2 Divorced.  Daughters 51 and 9 yo, doing very well.  RP:  Established patient presenting for annual gyn exam   HPI: Menopause, well on no HRT.  No PMB.  No pelvic pain.  Abstinent x divorce a few yrs ago.  Breasts normal.  BMI 21.47.  Exercising regularly and healthy nutrition.  Did not start on antidepressant last year and overcame the depressive symptoms through her faith, personal work and time. Health labs with family physician.  Past medical history,surgical history, family history and social history were all reviewed and documented in the EPIC chart.  Gynecologic History No LMP recorded. Patient is postmenopausal. Contraception: abstinence and post menopausal status Last Pap: 12/2016. Results were: Negative Last mammogram: 07/2017. Results were: Negative Bone Density: 01/2017 Osteoporosis Lowest T-Score at Spine -3.3.  Not on medical treatment. Colonoscopy: 2019 benign polyp  Obstetric History OB History  Gravida Para Term Preterm AB Living  2 2       2   SAB TAB Ectopic Multiple Live Births               # Outcome Date GA Lbr Len/2nd Weight Sex Delivery Anes PTL Lv  2 Para           1 Para              ROS: A ROS was performed and pertinent positives and negatives are included in the history.  GENERAL: No fevers or chills. HEENT: No change in vision, no earache, sore throat or sinus congestion. NECK: No pain or stiffness. CARDIOVASCULAR: No chest pain or pressure. No palpitations. PULMONARY: No shortness of breath, cough or wheeze. GASTROINTESTINAL: No abdominal pain, nausea, vomiting or diarrhea, melena or bright red blood per rectum. GENITOURINARY: No urinary frequency, urgency, hesitancy or dysuria. MUSCULOSKELETAL: No joint or muscle pain, no back pain, no recent trauma. DERMATOLOGIC: No rash, no itching, no lesions. ENDOCRINE: No polyuria, polydipsia, no heat or cold intolerance. No recent change in weight.  HEMATOLOGICAL: No anemia or easy bruising or bleeding. NEUROLOGIC: No headache, seizures, numbness, tingling or weakness. PSYCHIATRIC: No depression, no loss of interest in normal activity or change in sleep pattern.     Exam:   BP 128/86   Ht 5' 6.25" (1.683 m)   Wt 134 lb (60.8 kg)   BMI 21.47 kg/m   Body mass index is 21.47 kg/m.  General appearance : Well developed well nourished female. No acute distress HEENT: Eyes: no retinal hemorrhage or exudates,  Neck supple, trachea midline, no carotid bruits, no thyroidmegaly Lungs: Clear to auscultation, no rhonchi or wheezes, or rib retractions  Heart: Regular rate and rhythm, no murmurs or gallops Breast:Examined in sitting and supine position were symmetrical in appearance, no palpable masses or tenderness,  no skin retraction, no nipple inversion, no nipple discharge, no skin discoloration, no axillary or supraclavicular lymphadenopathy Abdomen: no palpable masses or tenderness, no rebound or guarding Extremities: no edema or skin discoloration or tenderness  Pelvic: Vulva: Normal             Vagina: No gross lesions or discharge  Cervix: No gross lesions or discharge.  Pap/HPV HR done  Uterus  AV, normal size, shape and consistency, non-tender and mobile  Adnexa  Without masses or tenderness  Anus: Normal   Assessment/Plan:  57 y.o. female for annual exam   1. Encounter for routine gynecological examination with Papanicolaou smear of cervix  Normal gynecologic exam and menopause.  Pap with high-risk HPV done today.  Breast exam normal.  Screening mammogram March 2019 was negative.  Good body mass index at 21.47.  Fit and having a healthy nutrition.  Health labs with family physician.  Colonoscopy in 2019. - PAP,TP IMGw/HPV RNA,rflx HPVTYPE16,18/45  2. Postmenopausal Well on no hormone replacement therapy.  No postmenopausal bleeding.  3. Age-related osteoporosis without current pathological fracture Bone density on September  2018 showed osteoporosis at all sites with the lowest site at the spine with a T score of -3.3.  Osteoporosis and risks of fractures reviewed with patient.  Strongly recommended to start on medical therapy.  Usage, risks and benefits of Fosamax reviewed with patient.  Decision to start on Fosamax.  Prescription sent to pharmacy.  Also recommend vitamin D supplements, calcium intake of 1.5 g/day and regular weightbearing physical activity.  Will repeat the bone density in September 2020.  4. Special screening examination for human papillomavirus (HPV) - PAP,TP IMGw/HPV RNA,rflx HPVTYPE16,18/45  Other orders - alendronate (FOSAMAX) 70 MG tablet; Take 1 tablet (70 mg total) by mouth every 7 (seven) days. Take with a full glass of water on an empty stomach.  Counseling on above issues and coordination of care more than 50% for 15 minutes.  Genia Del MD, 4:29 PM 04/01/2018

## 2018-04-02 ENCOUNTER — Encounter: Payer: Self-pay | Admitting: Obstetrics & Gynecology

## 2018-04-02 NOTE — Patient Instructions (Addendum)
1. Encounter for routine gynecological examination with Papanicolaou smear of cervix Normal gynecologic exam and menopause.  Pap with high-risk HPV done today.  Breast exam normal.  Screening mammogram March 2019 was negative.  Good body mass index at 21.47.  Fit and having a healthy nutrition.  Health labs with family physician.  Colonoscopy in 2019. - PAP,TP IMGw/HPV RNA,rflx HPVTYPE16,18/45  2. Postmenopausal Well on no hormone replacement therapy.  No postmenopausal bleeding.  3. Age-related osteoporosis without current pathological fracture Bone density on September 2018 showed osteoporosis at all sites with the lowest site at the spine with a T score of -3.3.  Osteoporosis and risks of fractures reviewed with patient.  Strongly recommended to start on medical therapy.  Usage, risks and benefits of Fosamax reviewed with patient.  Decision to start on Fosamax.  Prescription sent to pharmacy.  Also recommend vitamin D supplements, calcium intake of 1.5 g/day and regular weightbearing physical activity.  Will repeat the bone density in September 2020.  4. Special screening examination for human papillomavirus (HPV) - PAP,TP IMGw/HPV RNA,rflx HPVTYPE16,18/45  Other orders - alendronate (FOSAMAX) 70 MG tablet; Take 1 tablet (70 mg total) by mouth every 7 (seven) days. Take with a full glass of water on an empty stomach.  Lakrisha, it was a pleasure seeing you today!  I will inform you of your results as soon as they are available.  Alendronate weekly tablets What is this medicine? ALENDRONATE (a LEN droe nate) slows calcium loss from bones. It helps to make healthy bone and to slow bone loss in people with osteoporosis. It may be used to treat Paget's disease. This medicine may be used for other purposes; ask your health care provider or pharmacist if you have questions. COMMON BRAND NAME(S): Fosamax What should I tell my health care provider before I take this medicine? They need to know if you  have any of these conditions: -esophagus, stomach, or intestine problems, like acid-reflux or GERD -dental disease -kidney disease -low blood calcium -low vitamin D -problems swallowing -problems sitting or standing for 30 minutes -an unusual or allergic reaction to alendronate, other medicines, foods, dyes, or preservatives -pregnant or trying to get pregnant -breast-feeding How should I use this medicine? You must take this medicine exactly as directed or you will lower the amount of medicine you absorb into your body or you may cause yourself harm. Take your dose by mouth first thing in the morning, after you are up for the day. Do not eat or drink anything before you take this medicine. Swallow your medicine with a full glass (6 to 8 fluid ounces) of plain water. Do not take this tablet with any other drink. Do not chew or crush the tablet. After taking this medicine, do not eat breakfast, drink, or take any medicines or vitamins for at least 30 minutes. Stand or sit up for at least 30 minutes after you take this medicine; do not lie down. Take this medicine on the same day every week. Do not take your medicine more often than directed. Talk to your pediatrician regarding the use of this medicine in children. Special care may be needed. Overdosage: If you think you have taken too much of this medicine contact a poison control center or emergency room at once. NOTE: This medicine is only for you. Do not share this medicine with others. What if I miss a dose? If you miss a dose, take the dose on the morning after you remember. Then take your  next dose on your regular day of the week. Never take 2 tablets on the same day. Do not take double or extra doses. What may interact with this medicine? -aluminum hydroxide -antacids -aspirin -calcium supplements -drugs for inflammation like ibuprofen, naproxen, and others -iron supplements -magnesium supplements -vitamins with minerals This list  may not describe all possible interactions. Give your health care provider a list of all the medicines, herbs, non-prescription drugs, or dietary supplements you use. Also tell them if you smoke, drink alcohol, or use illegal drugs. Some items may interact with your medicine. What should I watch for while using this medicine? Visit your doctor or health care professional for regular checks ups. It may be some time before you see benefit from this medicine. Do not stop taking your medication except on your doctor's advice. Your doctor or health care professional may order blood tests and other tests to see how you are doing. You should make sure you get enough calcium and vitamin D while you are taking this medicine, unless your doctor tells you not to. Discuss the foods you eat and the vitamins you take with your health care professional. Some people who take this medicine have severe bone, joint, and/or muscle pain. This medicine may also increase your risk for a broken thigh bone. Tell your doctor right away if you have pain in your upper leg or groin. Tell your doctor if you have any pain that does not go away or that gets worse. This medicine can make you more sensitive to the sun. If you get a rash while taking this medicine, sunlight may cause the rash to get worse. Keep out of the sun. If you cannot avoid being in the sun, wear protective clothing and use sunscreen. Do not use sun lamps or tanning beds/booths. What side effects may I notice from receiving this medicine? Side effects that you should report to your doctor or health care professional as soon as possible: -allergic reactions like skin rash, itching or hives, swelling of the face, lips, or tongue -black or tarry stools -bone, muscle or joint pain -changes in vision -chest pain -heartburn or stomach pain -jaw pain, especially after dental work -pain or trouble when swallowing -redness, blistering, peeling or loosening of the skin,  including inside the mouth Side effects that usually do not require medical attention (report to your doctor or health care professional if they continue or are bothersome): -changes in taste -diarrhea or constipation -eye pain or itching -headache -nausea or vomiting -stomach gas or fullness This list may not describe all possible side effects. Call your doctor for medical advice about side effects. You may report side effects to FDA at 1-800-FDA-1088. Where should I keep my medicine? Keep out of the reach of children. Store at room temperature of 15 and 30 degrees C (59 and 86 degrees F). Throw away any unused medicine after the expiration date. NOTE: This sheet is a summary. It may not cover all possible information. If you have questions about this medicine, talk to your doctor, pharmacist, or health care provider.  2018 Elsevier/Gold Standard (2011-03-29 09:02:42)  Osteoporosis Osteoporosis is the thinning and loss of density in the bones. Osteoporosis makes the bones more brittle, fragile, and likely to break (fracture). Over time, osteoporosis can cause the bones to become so weak that they fracture after a simple fall. The bones most likely to fracture are the bones in the hip, wrist, and spine. What are the causes? The exact cause is  not known. What increases the risk? Anyone can develop osteoporosis. You may be at greater risk if you have a family history of the condition or have poor nutrition. You may also have a higher risk if you are:  Female.  31 years old or older.  A smoker.  Not physically active.  White or Asian.  Slender.  What are the signs or symptoms? A fracture might be the first sign of the disease, especially if it results from a fall or injury that would not usually cause a bone to break. Other signs and symptoms include:  Low back and neck pain.  Stooped posture.  Height loss.  How is this diagnosed? To make a diagnosis, your health care  provider may:  Take a medical history.  Perform a physical exam.  Order tests, such as: ? A bone mineral density test. ? A dual-energy X-ray absorptiometry test.  How is this treated? The goal of osteoporosis treatment is to strengthen your bones to reduce your risk of a fracture. Treatment may involve:  Making lifestyle changes, such as: ? Eating a diet rich in calcium. ? Doing weight-bearing and muscle-strengthening exercises. ? Stopping tobacco use. ? Limiting alcohol intake.  Taking medicine to slow the process of bone loss or to increase bone density.  Monitoring your levels of calcium and vitamin D.  Follow these instructions at home:  Include calcium and vitamin D in your diet. Calcium is important for bone health, and vitamin D helps the body absorb calcium.  Perform weight-bearing and muscle-strengthening exercises as directed by your health care provider.  Do not use any tobacco products, including cigarettes, chewing tobacco, and electronic cigarettes. If you need help quitting, ask your health care provider.  Limit your alcohol intake.  Take medicines only as directed by your health care provider.  Keep all follow-up visits as directed by your health care provider. This is important.  Take precautions at home to lower your risk of falling, such as: ? Keeping rooms well lit and clutter free. ? Installing safety rails on stairs. ? Using rubber mats in the bathroom and other areas that are often wet or slippery. Get help right away if: You fall or injure yourself. This information is not intended to replace advice given to you by your health care provider. Make sure you discuss any questions you have with your health care provider. Document Released: 02/21/2005 Document Revised: 10/17/2015 Document Reviewed: 10/22/2013 Elsevier Interactive Patient Education  [employer].

## 2018-04-03 ENCOUNTER — Encounter: Payer: Self-pay | Admitting: Family Medicine

## 2018-04-03 ENCOUNTER — Ambulatory Visit (INDEPENDENT_AMBULATORY_CARE_PROVIDER_SITE_OTHER): Payer: BLUE CROSS/BLUE SHIELD | Admitting: Family Medicine

## 2018-04-03 ENCOUNTER — Other Ambulatory Visit: Payer: Self-pay

## 2018-04-03 VITALS — BP 112/80 | HR 79 | Temp 98.1°F | Resp 16 | Ht 66.0 in | Wt 133.0 lb

## 2018-04-03 DIAGNOSIS — M810 Age-related osteoporosis without current pathological fracture: Secondary | ICD-10-CM | POA: Diagnosis not present

## 2018-04-03 DIAGNOSIS — Z0000 Encounter for general adult medical examination without abnormal findings: Secondary | ICD-10-CM | POA: Diagnosis not present

## 2018-04-03 DIAGNOSIS — E785 Hyperlipidemia, unspecified: Secondary | ICD-10-CM

## 2018-04-03 LAB — PAP, TP IMAGING W/ HPV RNA, RFLX HPV TYPE 16,18/45: HPV DNA High Risk: NOT DETECTED

## 2018-04-03 NOTE — Assessment & Plan Note (Signed)
Ongoing issue for pt but she hasn't been on medication.  Check labs and start meds prn.

## 2018-04-03 NOTE — Assessment & Plan Note (Signed)
Pt's PE WNL.  UTD on GYN, colonoscopy, immunizations.  Check labs.  Anticipatory guidance provided.  

## 2018-04-03 NOTE — Progress Notes (Signed)
   Subjective:    Patient ID: Alexandra Hopkins, female    DOB: 01/03/1961, 57 y.o.   MRN: [id number]  HPI CPE- UTD on pap, mammo, colonoscopy, DEXA.  Declines flu.   Review of Systems Patient reports no vision/ hearing changes, adenopathy,fever, weight change,  persistant/recurrent hoarseness , swallowing issues, chest pain, palpitations, edema, persistant/recurrent cough, hemoptysis, dyspnea (rest/exertional/paroxysmal nocturnal), gastrointestinal bleeding (melena, rectal bleeding), abdominal pain, significant heartburn, bowel changes, GU symptoms (dysuria, hematuria, incontinence), Gyn symptoms (abnormal  bleeding, pain),  syncope, focal weakness, memory loss, numbness & tingling, skin/hair/nail changes, abnormal bruising or bleeding, anxiety, or depression.     Objective:   Physical Exam General Appearance:    Alert, cooperative, no distress, appears stated age  Head:    Normocephalic, without obvious abnormality, atraumatic  Eyes:    PERRL, conjunctiva/corneas clear, EOM's intact, fundi    benign, both eyes  Ears:    Normal TM's and external ear canals, both ears  Nose:   Nares normal, septum midline, mucosa normal, no drainage    or sinus tenderness  Throat:   Lips, mucosa, and tongue normal; teeth and gums normal  Neck:   Supple, symmetrical, trachea midline, no adenopathy;    Thyroid: no enlargement/tenderness/nodules  Back:     Symmetric, no curvature, ROM normal, no CVA tenderness  Lungs:     Clear to auscultation bilaterally, respirations unlabored  Chest Wall:    No tenderness or deformity   Heart:    Regular rate and rhythm, S1 and S2 normal, no murmur, rub   or gallop  Breast Exam:    Deferred to GYN  Abdomen:     Soft, non-tender, bowel sounds active all four quadrants,    no masses, no organomegaly  Genitalia:    Deferred to GYN  Rectal:    Extremities:   Extremities normal, atraumatic, no cyanosis or edema  Pulses:   2+ and symmetric all extremities  Skin:   Skin  color, texture, turgor normal, no rashes or lesions  Lymph nodes:   Cervical, supraclavicular, and axillary nodes normal  Neurologic:   CNII-XII intact, normal strength, sensation and reflexes    throughout          Assessment & Plan:

## 2018-04-03 NOTE — Assessment & Plan Note (Signed)
Pt's lowest T score is -3.3.  She is not currently on anything but GYN recommended that she look into Fosamax.  Pt is interested in other options.  Discussed Prolia.  She plans to call GYN.  Check Vit D and replete prn.

## 2018-04-03 NOTE — Patient Instructions (Addendum)
Follow up in 1 year or as needed We'll notify you of your lab results and make any changes if needed Call GYN and discuss starting Prolia injections for the osteoporosis For nutrition, call [facility] Pers[facility] and set up an appt [phone] Call with any questions or concerns Happy Holidays!

## 2018-04-04 ENCOUNTER — Other Ambulatory Visit: Payer: Self-pay | Admitting: General Practice

## 2018-04-04 DIAGNOSIS — E785 Hyperlipidemia, unspecified: Secondary | ICD-10-CM

## 2018-04-04 LAB — HEPATIC FUNCTION PANEL
ALBUMIN: 4.7 g/dL (ref 3.5–5.2)
ALT: 20 U/L (ref 0–35)
AST: 22 U/L (ref 0–37)
Alkaline Phosphatase: 98 U/L (ref 39–117)
BILIRUBIN DIRECT: 0.1 mg/dL (ref 0.0–0.3)
Total Bilirubin: 0.6 mg/dL (ref 0.2–1.2)
Total Protein: 7.7 g/dL (ref 6.0–8.3)

## 2018-04-04 LAB — BASIC METABOLIC PANEL
BUN: 10 mg/dL (ref 6–23)
CHLORIDE: 104 meq/L (ref 96–112)
CO2: 27 mEq/L (ref 19–32)
Calcium: 10.6 mg/dL — ABNORMAL HIGH (ref 8.4–10.5)
Creatinine, Ser: 0.84 mg/dL (ref 0.40–1.20)
GFR: 73.86 mL/min (ref >60.00)
Glucose, Bld: 95 mg/dL (ref 70–99)
POTASSIUM: 4.3 meq/L (ref 3.5–5.1)
SODIUM: 138 meq/L (ref 135–145)

## 2018-04-04 LAB — CBC WITH DIFFERENTIAL/PLATELET
BASOS PCT: 1.5 % (ref 0.0–3.0)
Basophils Absolute: 0.1 10*3/uL (ref 0.0–0.1)
EOS PCT: 2.9 % (ref 0.0–5.0)
Eosinophils Absolute: 0.2 10*3/uL (ref 0.0–0.7)
HCT: 40.5 % (ref 36.0–46.0)
Hemoglobin: 13.9 g/dL (ref 12.0–15.0)
Lymphocytes Relative: 29.7 % (ref 12.0–46.0)
Lymphs Abs: 2.4 10*3/uL (ref 0.7–4.0)
MCHC: 34.2 g/dL (ref 30.0–36.0)
MCV: 90.9 fl (ref 78.0–100.0)
MONO ABS: 0.5 10*3/uL (ref 0.1–1.0)
MONOS PCT: 6.7 % (ref 3.0–12.0)
NEUTROS ABS: 4.7 10*3/uL (ref 1.4–7.7)
NEUTROS PCT: 59.2 % (ref 43.0–77.0)
PLATELETS: 289 10*3/uL (ref 150.0–400.0)
RBC: 4.46 Mil/uL (ref 3.87–5.11)
RDW: 13.6 % (ref 11.5–15.5)
WBC: 7.9 10*3/uL (ref 4.0–10.5)

## 2018-04-04 LAB — LIPID PANEL
CHOL/HDL RATIO: 4
CHOLESTEROL: 280 mg/dL — AB (ref 0–200)
HDL: 79.3 mg/dL (ref >39.00)
LDL CALC: 171 mg/dL — AB (ref 0–99)
NonHDL: 200.8
Triglycerides: 150 mg/dL — ABNORMAL HIGH (ref 0.0–149.0)
VLDL: 30 mg/dL (ref 0.0–40.0)

## 2018-04-04 LAB — TSH: TSH: 3.86 u[IU]/mL (ref 0.35–4.50)

## 2018-04-04 LAB — VITAMIN D 25 HYDROXY (VIT D DEFICIENCY, FRACTURES): VITD: 35.04 ng/mL (ref 30.00–100.00)

## 2018-04-04 MED ORDER — ATORVASTATIN CALCIUM 20 MG PO TABS: 20.0000 mg | ORAL_TABLET | Freq: Every day | ORAL | 3 refills | Status: DC

## 2018-07-01 ENCOUNTER — Other Ambulatory Visit: Payer: Self-pay | Admitting: Family Medicine

## 2018-07-01 ENCOUNTER — Other Ambulatory Visit: Payer: Self-pay | Admitting: Psychiatry

## 2018-08-04 IMAGING — MG DIGITAL SCREENING BILATERAL MAMMOGRAM WITH TOMO AND CAD
8 series · 9 of 24 positions shown · non-contrast
Comparison: Previous exam(s).

CLINICAL DATA: Screening.

EXAM:
DIGITAL SCREENING BILATERAL MAMMOGRAM WITH TOMO AND CAD

[R CC synth-2D]
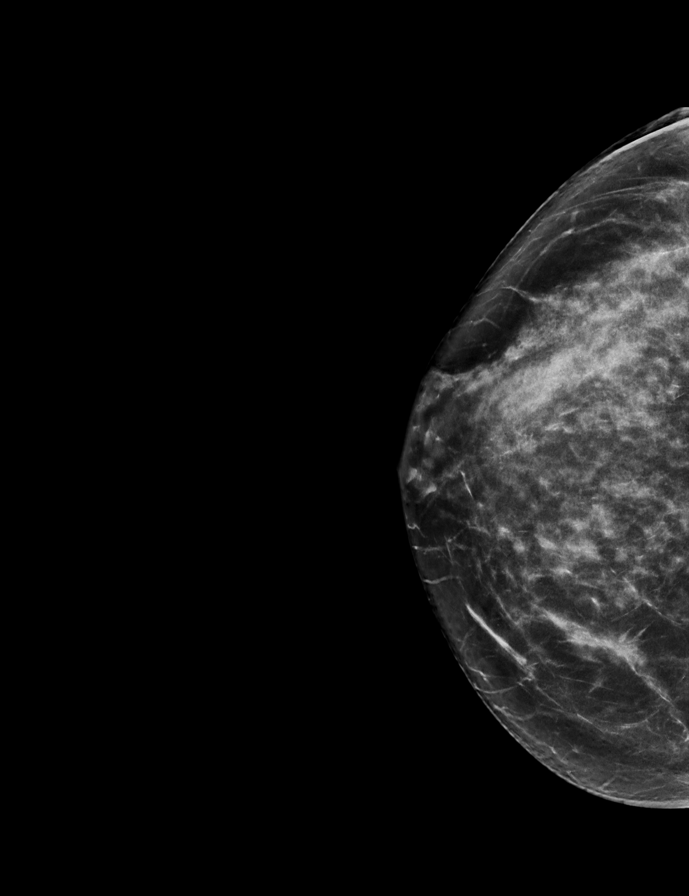

[L MLO synth-2D]
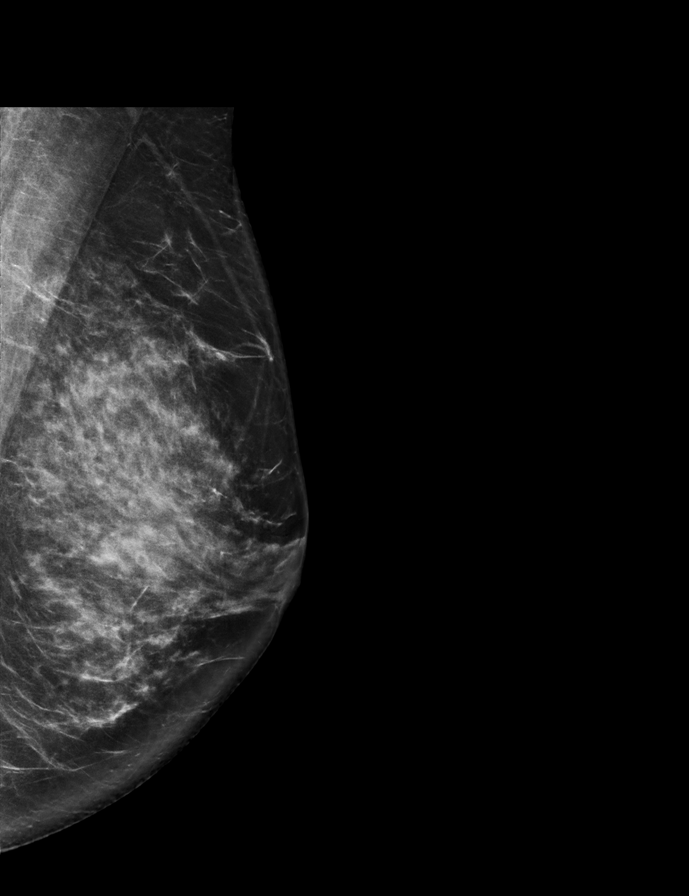

[L CC synth-2D]
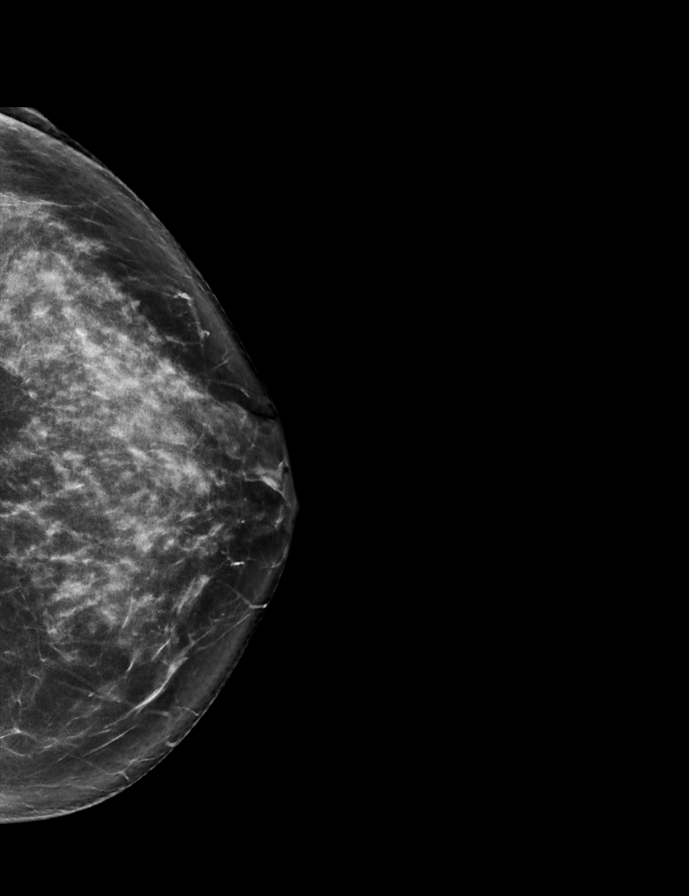

[R MLO synth-2D]
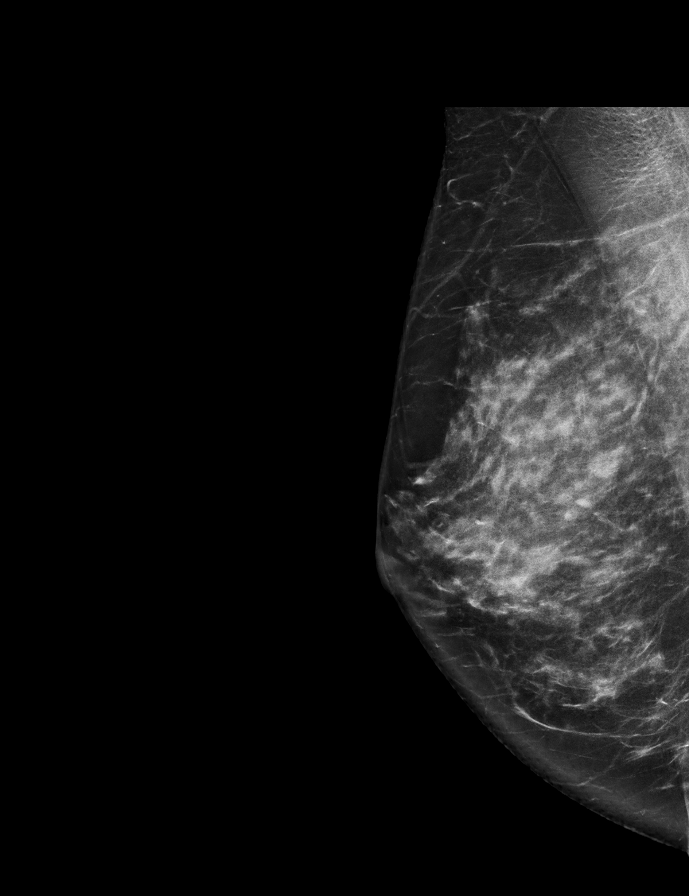

[R CC tomo · 2 of 73 frames shown]
[frame 24/73]
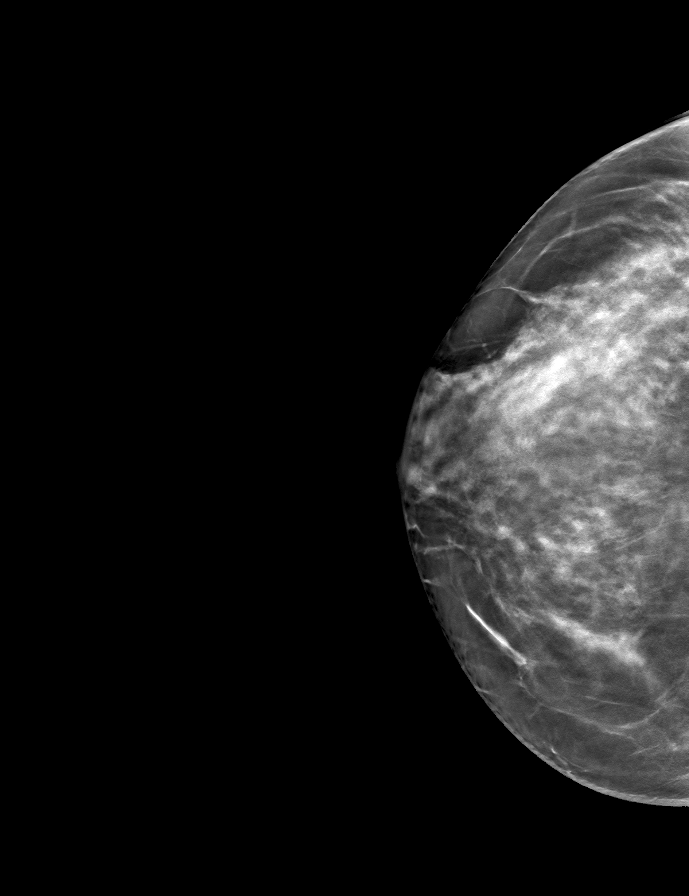
[frame 37/73]
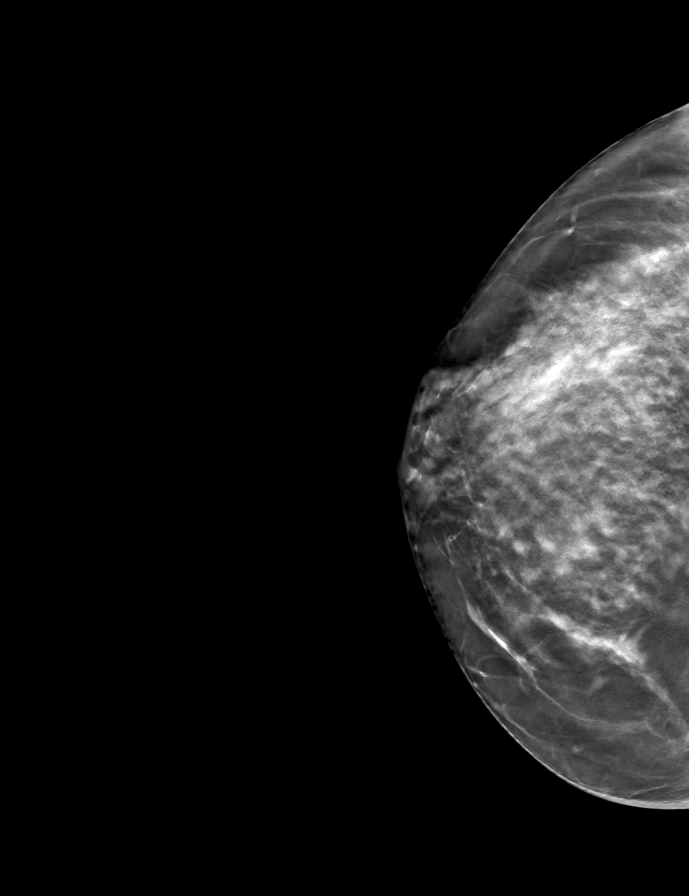

[R MLO tomo · tomo slice 39/77.0]
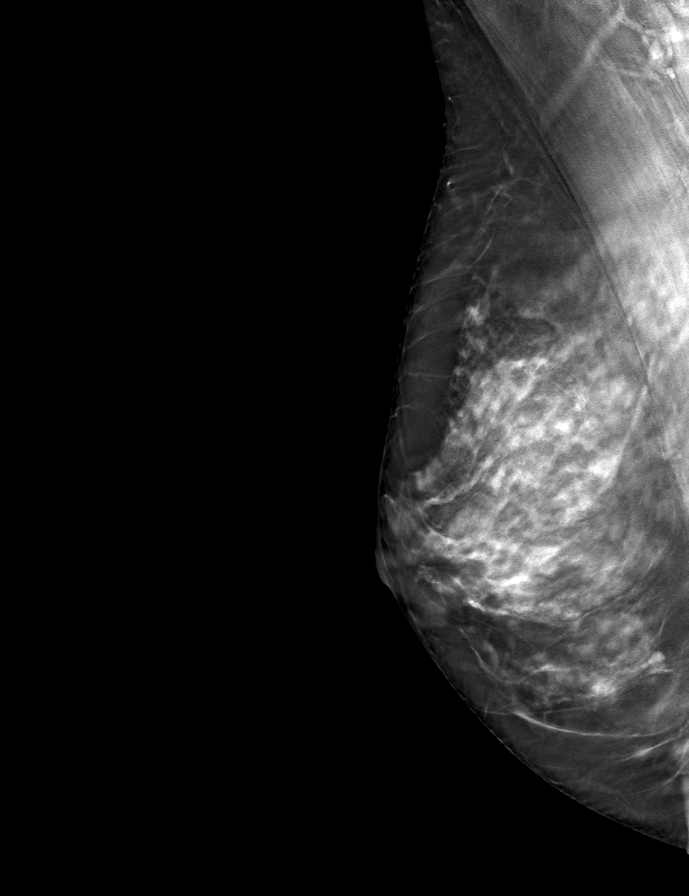

[L MLO tomo · tomo slice 39/76.0]
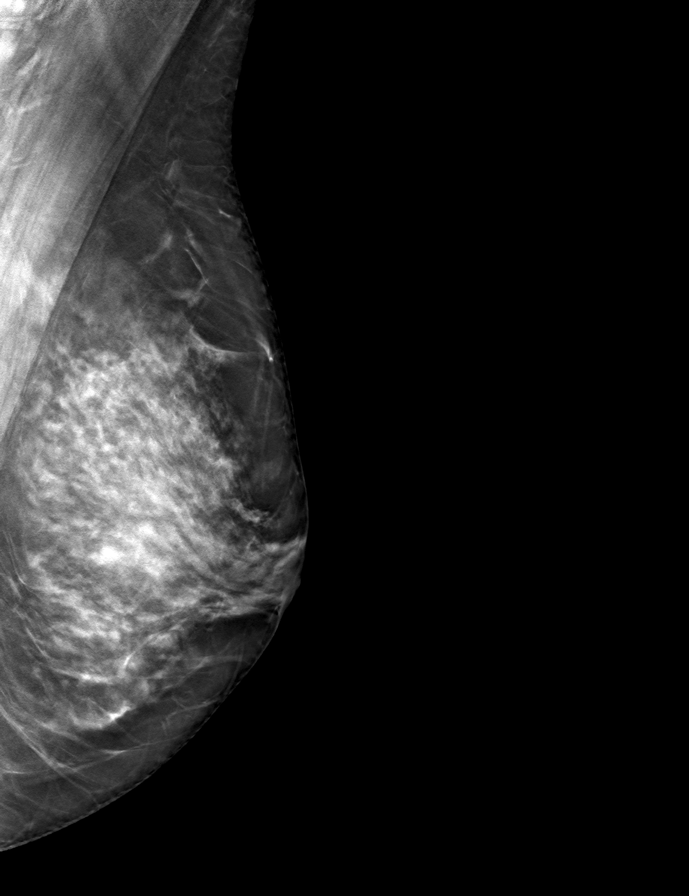

[L CC tomo · tomo slice 38/75.0]
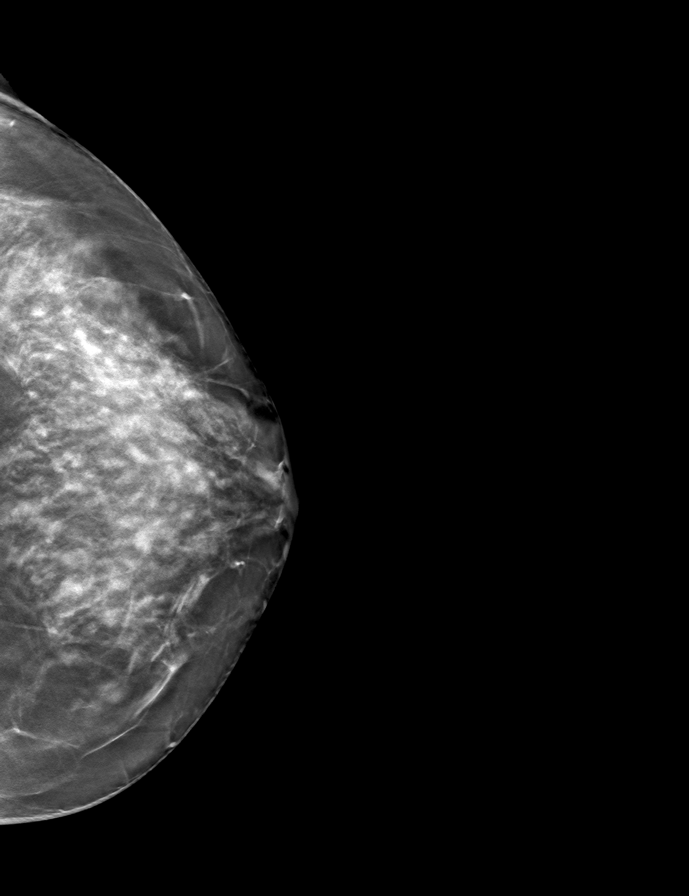

[9 of 24 positions shown; findings below may reference images not displayed]

ACR Breast Density Category d: The breast tissue is extremely dense,
which lowers the sensitivity of mammography.
FINDINGS: There are no findings suspicious for malignancy. Images were
processed with CAD.
IMPRESSION: No mammographic evidence of malignancy. A result letter of this
screening mammogram will be mailed directly to the patient.

RECOMMENDATION:
Screening mammogram in one year. (Code:RA-I-AVB)

BI-RADS CATEGORY  1: Negative.

## 2018-08-10 ENCOUNTER — Other Ambulatory Visit: Payer: Self-pay | Admitting: Family Medicine

## 2018-11-11 ENCOUNTER — Encounter: Payer: Self-pay | Admitting: Obstetrics & Gynecology

## 2019-02-24 ENCOUNTER — Encounter: Payer: Self-pay | Admitting: Gynecology

## 2019-05-18 ENCOUNTER — Other Ambulatory Visit: Payer: Self-pay | Admitting: Family Medicine

## 2019-11-23 ENCOUNTER — Ambulatory Visit: Payer: BLUE CROSS/BLUE SHIELD | Admitting: Podiatry

## 2020-11-23 ENCOUNTER — Encounter: Payer: Self-pay | Admitting: *Deleted
